# Patient Record
Sex: Male | Born: 1960 | Race: Black or African American | Hispanic: No | Marital: Married | Smoking: Current some day smoker
Health system: Southern US, Community
[De-identification: ages and names within clinical notes are randomized; demographics above are authoritative.]

## PROBLEM LIST (undated history)

## (undated) DIAGNOSIS — E7800 Pure hypercholesterolemia, unspecified: Secondary | ICD-10-CM

## (undated) DIAGNOSIS — I10 Essential (primary) hypertension: Secondary | ICD-10-CM

## (undated) DIAGNOSIS — Z9889 Other specified postprocedural states: Secondary | ICD-10-CM

## (undated) DIAGNOSIS — E119 Type 2 diabetes mellitus without complications: Secondary | ICD-10-CM

## (undated) DIAGNOSIS — M1990 Unspecified osteoarthritis, unspecified site: Secondary | ICD-10-CM

## (undated) DIAGNOSIS — G4730 Sleep apnea, unspecified: Secondary | ICD-10-CM

## (undated) DIAGNOSIS — Z8489 Family history of other specified conditions: Secondary | ICD-10-CM

## (undated) HISTORY — PX: CARDIAC CATHETERIZATION: SHX172

## (undated) HISTORY — PX: COLONOSCOPY: SHX174

---

## 2001-07-20 HISTORY — PX: KNEE ARTHROSCOPY: SUR90

## 2005-12-29 ENCOUNTER — Ambulatory Visit (HOSPITAL_BASED_OUTPATIENT_CLINIC_OR_DEPARTMENT_OTHER): Admission: RE | Admit: 2005-12-29 | Discharge: 2005-12-29 | Payer: Self-pay | Admitting: Orthopaedic Surgery

## 2006-03-31 ENCOUNTER — Ambulatory Visit (HOSPITAL_COMMUNITY): Admission: RE | Admit: 2006-03-31 | Discharge: 2006-04-01 | Payer: Self-pay | Admitting: Orthopaedic Surgery

## 2007-11-22 ENCOUNTER — Ambulatory Visit: Payer: Self-pay | Admitting: *Deleted

## 2007-11-22 ENCOUNTER — Inpatient Hospital Stay (HOSPITAL_COMMUNITY): Admission: RE | Admit: 2007-11-22 | Discharge: 2007-11-27 | Payer: Self-pay | Admitting: Neurosurgery

## 2009-04-27 IMAGING — RF DG LUMBAR SPINE 2-3V
1 series · 2 of 2 positions shown · non-contrast
Comparison: 03/31/2006

CLINICAL DATA: Lumbar spondylosis.  Operative evaluation.

LUMBAR SPINE - 2-3 VIEW

[Series 1: run · 2 of 2 slices shown]
[im 1/2]
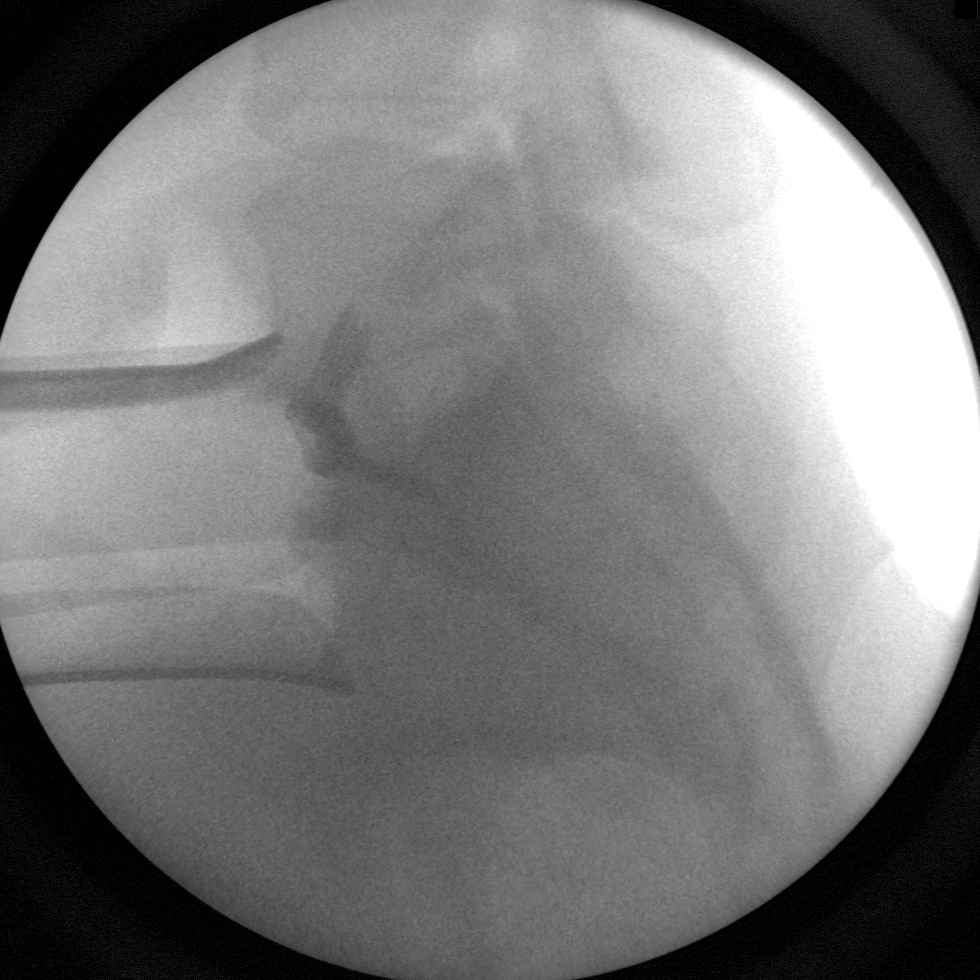
[im 2/2]
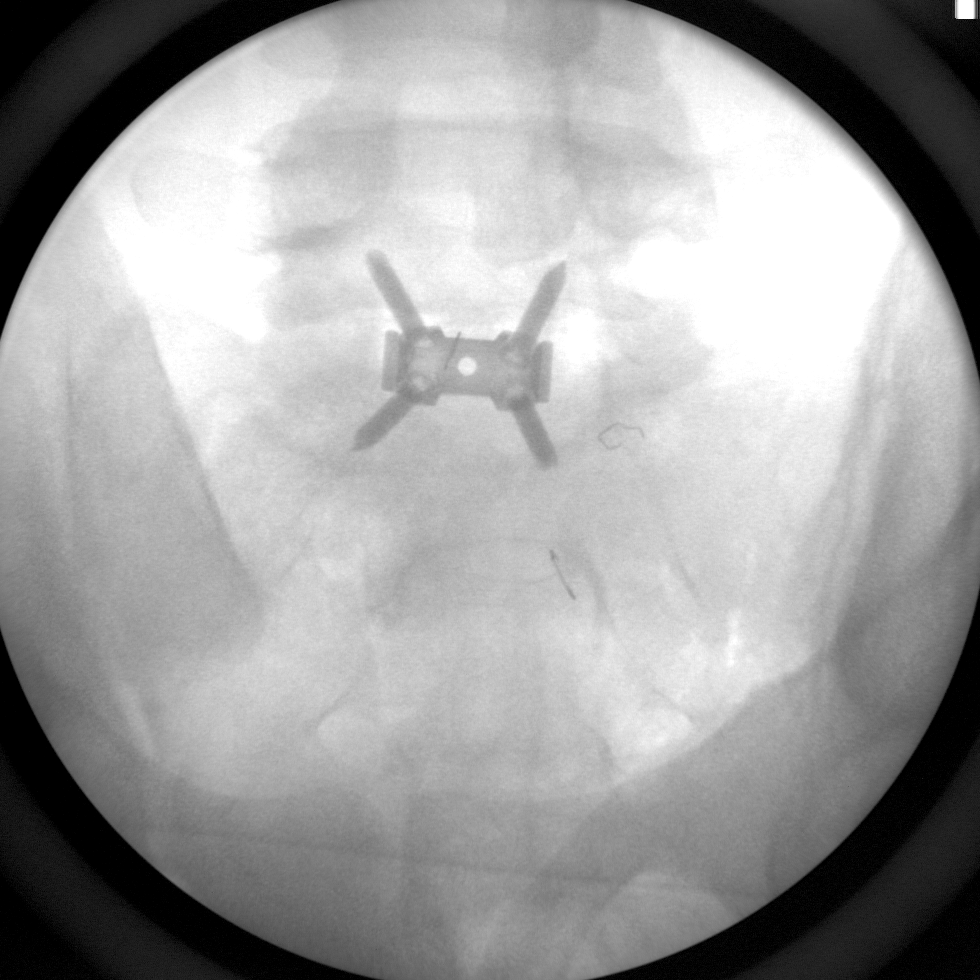

[2 of 2 positions shown; findings below may reference images not displayed]

two images show anterior fusion at the L5-S1 level.  Fusion device
appears well positioned.

## 2009-04-27 IMAGING — CR DG CHEST 1V PORT
1 series · 1 of 1 positions shown · non-contrast
Comparison: Radiograph 11/18/2007

CLINICAL DATA: Lumbar herniated disc repair

PORTABLE CHEST - 1 VIEW

[view not recorded]
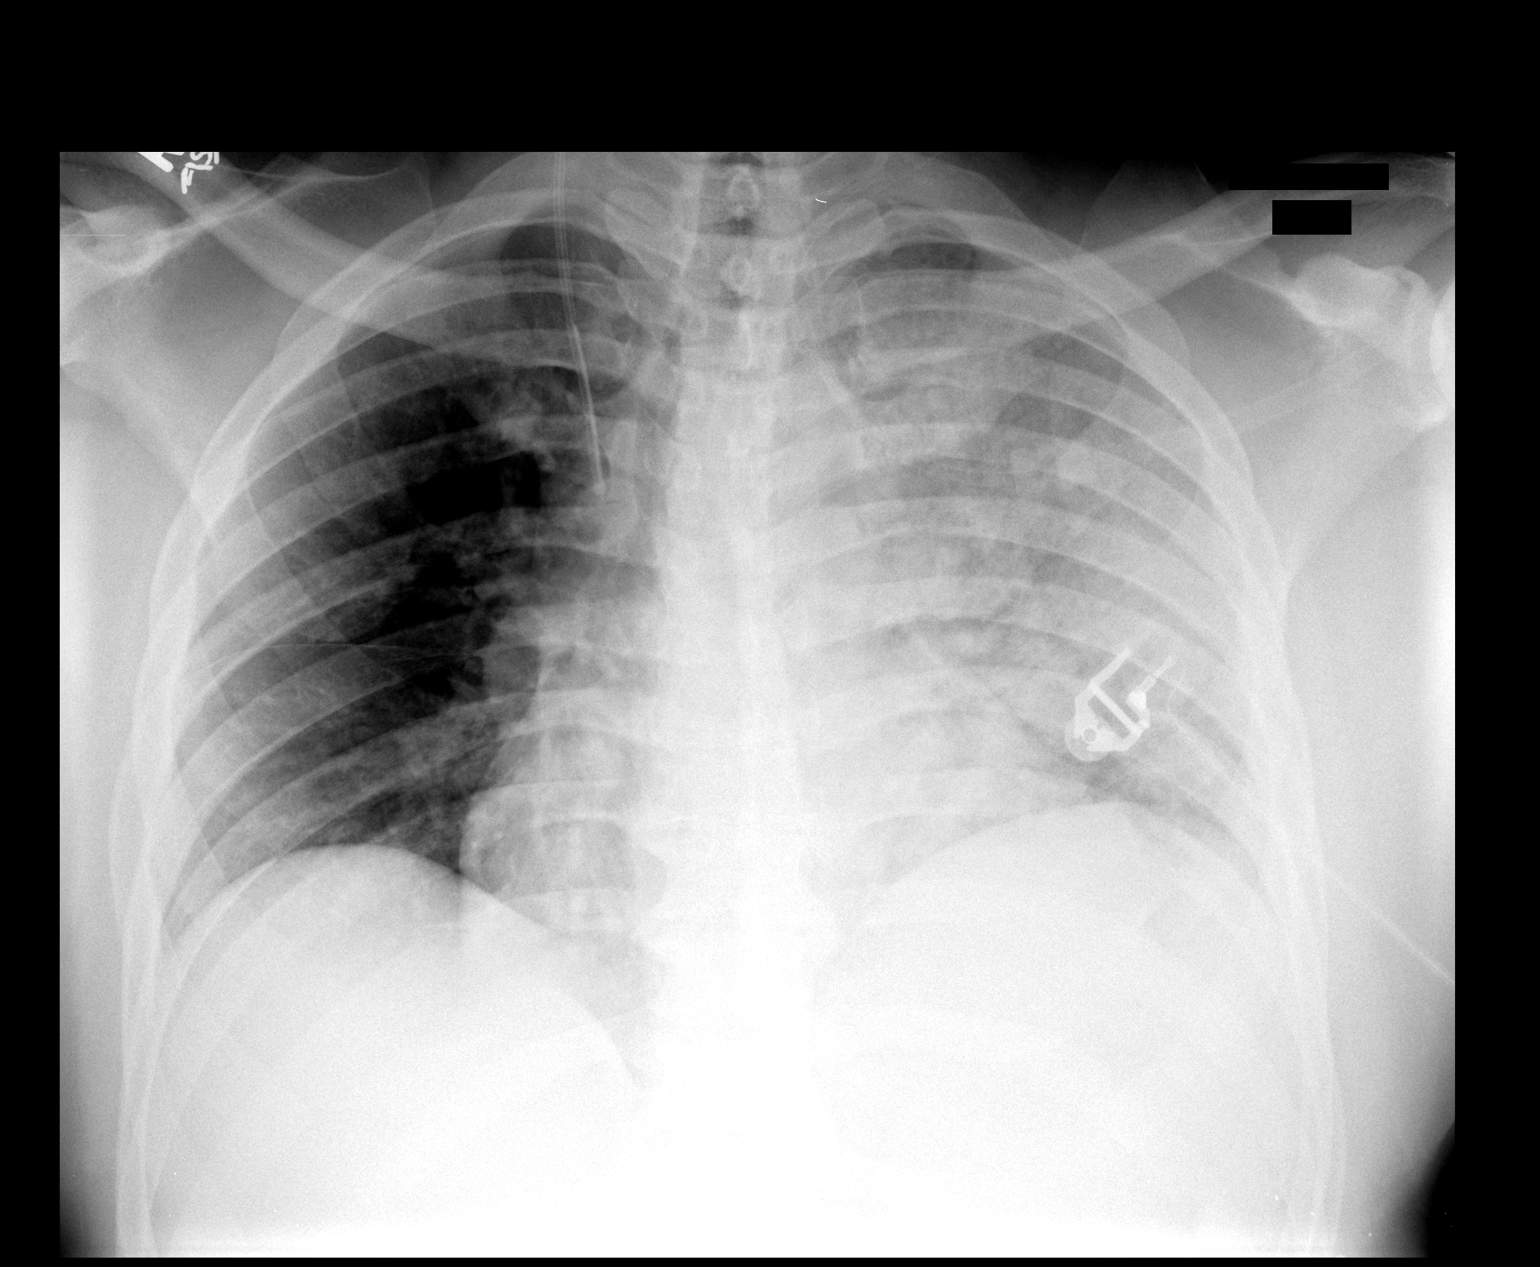

[1 of 1 positions shown; findings below may reference images not displayed]

FINDINGS: Interval placement of a right central venous catheter
from IJ approach with tip in the distal SVC.  No evidence of
pneumothorax.  Cardiac silhouette appears slightly enlarged.  This
may be in part due to position.  There is new diffuse opacity in
the left upper and lower lungs.
IMPRESSION: 1..  Interval placement of right central venous without evidence
pneumothorax.
2.  Opacity in the left hemithorax likely represents a combination
of  asymmetric pulmonary edema and pleural effusion.

## 2009-04-27 IMAGING — CR DG OR LOCAL ABDOMEN
2 series · 2 of 2 positions shown · non-contrast
Comparison: None

Addendum Begins

Two  additional cross table lateral views were provided.  No
evidence of retained surgical instruments.
Addendum Ends
CLINICAL DATA: Herniated lumbar disc.  The anterior fusion at L5,
S1
OR LOCAL ABDOMEN

[view not recorded (1 of 2)]
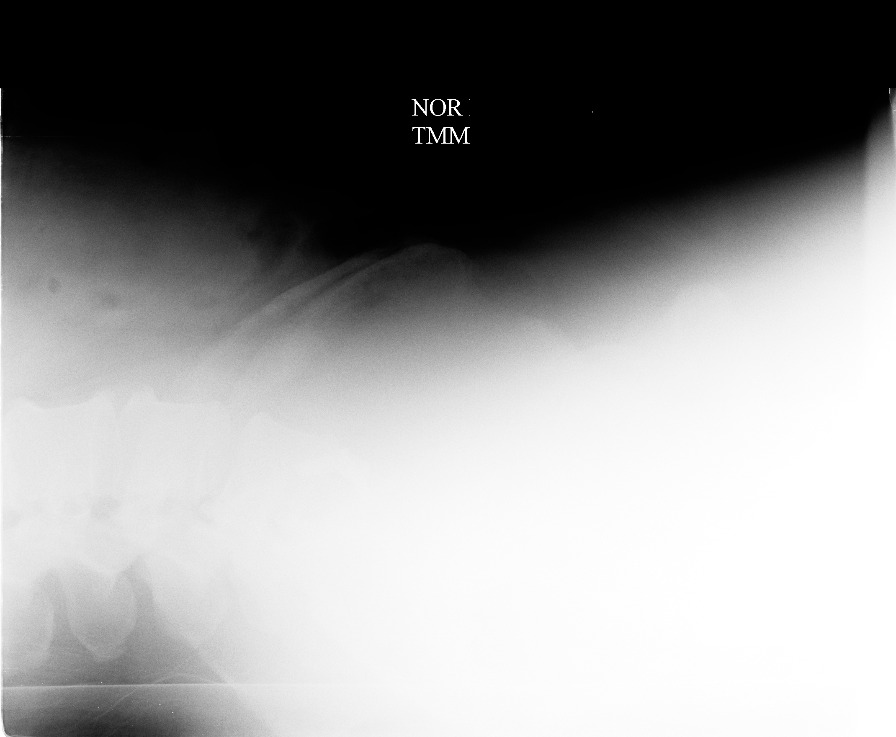

[view not recorded (2 of 2)]
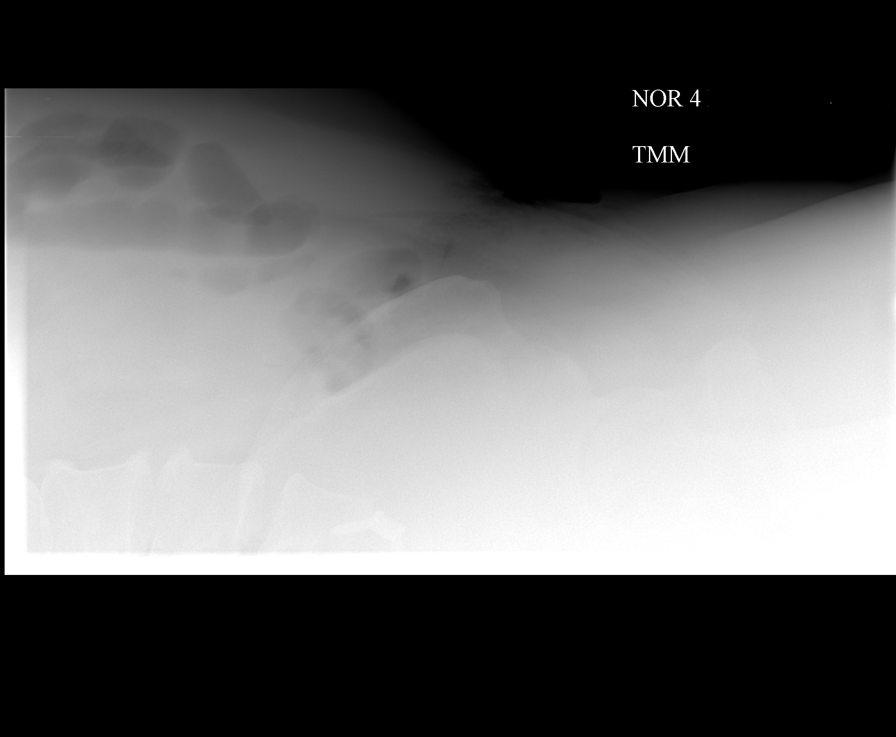

[2 of 2 positions shown; findings below may reference images not displayed]

FINDINGS: No  surgical instruments within the pelvis.  Anterior
fusion at L5, S1 present.  Staple projects over the medial left
aspect of the sacrum.
IMPRESSION: .  No evidence of surgical instruments.

## 2010-07-20 HISTORY — PX: BACK SURGERY: SHX140

## 2010-12-02 NOTE — Op Note (Signed)
NAMEOCTAVIAN, GODEK NO.:  [id number]   MEDICAL RECORD NO.:  [id number]          PATIENT TYPE:  INP   LOCATION:  3009                         FACILITY:  [facility]   PHYSICIAN:  Balinda Quails, M.D.    DATE OF BIRTH:  October 03, 1960   DATE OF PROCEDURE:  11/22/2007  DATE OF DISCHARGE:                               OPERATIVE REPORT   SURGEON:  Balinda Quails, MD   CO-SURGEONDanae Orleans. Venetia Maxon, MD   ANESTHESIA:  General endotracheal.   PREOPERATIVE DIAGNOSIS:  L5-S1 degenerative disk disease.   POSTOPERATIVE DIAGNOSIS:  L5-S1 degenerative disk disease.   PROCEDURE:  L5-S1 anterior lumbar interbody fusion (ALIF).   CLINICAL NOTE:  [name] is a 50 year old gentleman with a  history of chronic back pain and degenerative L5-S1 disk disease.  Scheduled today to undergo L5-S1 ALIF.  This patient was seen  preoperatively and the procedure was discussed in detail.  Also,  potential risks were reviewed including but not limited to DVT,  pulmonary embolus, limb ischemia, bleeding, transfusion, ureter injury,  sexual dysfunction, or other complication.  The patient consented for  surgery.   OPERATIVE PROCEDURE:  The patient was brought to the operating room in  stable hemodynamic condition.  Placed under general endotracheal  anesthesia.  Pulse oximetry placed on the left foot remained stable  throughout the operative procedure.   The abdomen was prepped and draped in sterile fashion.  Transverse skin  incision made in the left lower quadrant at premarked site of the  projection of L5-S1.  Dissection carried down through subcutaneous  tissue with electrocautery.  Left anterior rectus sheath was incised  from midline to lateral margin of rectus muscle.  The rectus muscle was  mobilized.  Retracted medially.  The left retroperitoneal space entered.  The peritoneal contents rotated anteriorly.  The psoas muscle identified  and the genitofemoral nerve preserved on the  psoas muscle.  The left  common and external iliac arteries were skeletonized.  Lymphatics pushed  laterally, ureter and nerves pushed medially.  The L5-S1 disk space was  palpated.  Using blunt dissection, the disk space was exposed and the  middle sacral vessels were ligated with clips and divided.  Presacral  fascia over the disk was incised and the soft tissues pushed off at the  L5-S1 disk from left to right.   The disk was fully exposed and using reverse lip Brau blades hooked on  the lateral margins of L5-S1.  The disk was fully exposed with malleable  retractors placed inferiorly and superiorly.   Dr. Venetia Maxon then completed L5-S1 ALIF.  Closure also carried out by Dr.  Venetia Maxon.   There were no apparent complications.  The patient was transferred to  recovery room in stable condition.      Balinda Quails, M.D.  Electronically Signed     PGH/MEDQ  D:  11/23/2007  T:  11/24/2007  Job:  [id number]

## 2010-12-02 NOTE — Op Note (Signed)
NAMERONNY, KORFF NO.:  [id number]   MEDICAL RECORD NO.:  [id number]          PATIENT TYPE:  INP   LOCATION:  3009                         FACILITY:  [facility]   PHYSICIAN:  Danae Orleans. Venetia Maxon, M.D.  DATE OF BIRTH:  January 11, 1961   DATE OF PROCEDURE:  11/22/2007  DATE OF DISCHARGE:                               OPERATIVE REPORT   PREOPERATIVE DIAGNOSES:  Herniated lumbar disk with spondylosis,  degenerative disk disease, and radiculopathy L5-S1 level.   POSTOPERATIVE DIAGNOSES:  Herniated lumbar disk with spondylosis,  degenerative disk disease, and radiculopathy L5-S1 level.   PROCEDURE:  L5-S1 decompression with anterior lumbar interbody fusion  with PEEK cage and screws, FortrOss bone graft extender with autogenous  blood and bone morphogenic protein.   SURGEON:  Danae Orleans. Venetia Maxon, MD   ASSISTANTMichail Jewels, RN and Madilyn Fireman, MD   ANESTHESIA:  General endotracheal anesthesia.   ESTIMATED BLOOD LOSS:  Minimal.   COMPLICATIONS:  None.   DISPOSITION:  Recovery.   INDICATIONS:  Colton Powell is a 50 year old man with a lumbar disk  herniation at L5-S1, he has previously undergone foraminotomy at L5-S1  on the right by another surgeon, he has recurrent right leg pain with L5  radiculopathy.  It was elected to take him to surgery for decompression  and anterior lumbar interbody fusion at the L5-S1 level.   PROCEDURE:  Colton Powell was brought to the operating room.  Following a  satisfactory and uncomplicated induction of general endotracheal  anesthesia and placement of intravenous lines and Foley catheter, he was  placed in a supine position on the operating table.  His low abdomen and  upper pelvic region and upper pubic region were shaved, then prepped and  draped in usual sterile fashion using Betadine scrub and paint.  After  localizing C-arm fluoroscopy, images were obtained.  An incision was  marked and carried through the skin to the rectus sheath, which was  opened with electrocautery.  This was performed by Dr. Madilyn Fireman, I assisted  him.  He will dictate this portion of procedure.  After Thompson  retractors were placed to expose the L5-S1 level, I took over and  performed a thorough diskectomy incising the anterior annulus.  An  intraoperative localizing x-ray with a marker needle at the L5-S1 level  was obtained demonstrating both midline and correct level.  The disk  space was then incised, disk material was removed in piecemeal fashion,  and end plates were decorticated, and cleared of investing disk  material.  There was a significant amount of spondylitic material deep  within the interspace.  This approach was extremely difficult because of  extremely steep sacral inflammation and the patient's large body  habitus, but it was possible to thoroughly remove the disk material and  clear the disk space of residual disk material, also spondylitic end  plate overgrowth was removed particularly on the right side, which is  the patient's symptomatic side with a variety of Kerrison rongeurs.  The  end plates were stripped of residual disk material and the decompression  was carried to the posterior annulus.  After trial sizing a variety of  implants, a PEEK titanium SynFix cage measuring 30 mm x 38 mm with 12 mm  width and a 12-degree lordosis height was then selected, prepared with  FortrOss with autogenous blood, which had been aspirated from the marrow  rich red vertebral body in the sacrum and also extra small BMP kit.  This was inserted using the SQUID device, deployed the implant and it  was then fixed using 20-mm screws according to standard fashion with  good rigid-appearing fixation.  AP and lateral radiographs demonstrated  well-positioned spacer, and it was felt that his height was  significantly reestablished.  The remaining FortrOss was packed around  the cage.  The wound was then irrigated, the Thompson retractors  removed, there was  no evidence of any vascular injury.  The fascia was  then closed with 0 Vicryl running stitches and subcutaneous layer  reapproximated with 2-0 Vicryl interrupted inverted sutures and skin  edges reapproximated with interrupted 3-0 Vicryl subcuticular stitch.  The wound was dressed with Benzoin, Steri-Strips, and sterile occlusive  dressing was placed.  At the end of this procedure, an AP film was  obtained, which demonstrated well-positioned interbody graft, but also  retained staple.  Additional x-rays demonstrated this was not  superficial, and I spoke with Dr. Madilyn Fireman who was of the opinion that this  was not a concern and this should be left alone.  Sterile occlusive  dressing was placed.  The patient was then allowed to wake up in the  operating room, extubated, and taken to recovery in stable and  satisfactory condition having tolerated the procedure well.      Danae Orleans. Venetia Maxon, M.D.  Electronically Signed     JDS/MEDQ  D:  11/22/2007  T:  11/23/2007  Job:  [id number]

## 2010-12-05 NOTE — Discharge Summary (Signed)
NAMEALMUS, WOODHAM NO.:  [id number]   MEDICAL RECORD NO.:  [id number]          PATIENT TYPE:  INP   LOCATION:  3009                         FACILITY:  [facility]   PHYSICIAN:  Danae Orleans. Venetia Maxon, M.D.  DATE OF BIRTH:  April 09, 1961   DATE OF ADMISSION:  11/22/2007  DATE OF DISCHARGE:  11/27/2007                               DISCHARGE SUMMARY   REASON FOR ADMISSION:  1. Lumbosacral disk displacement.  2. Lumbosacral disk degeneration.  3. Lumbosacral spondylosis.  4. Hypertension, NOS.   FINAL DIAGNOSES:  1. Lumbosacral disk displacement.  2. Lumbosacral  disk degeneration.  3. Lumbosacral spondylosis.  4. Hypertension, NOS.   HISTORY OF ILLNESS AND HOSPITAL COURSE:  Colton Powell is a 50 year old  man with a herniated lumbar disk with significant foraminal stenosis and  disk degeneration at the L5 to S1 level, was elected to take him to  surgery for L5 to S1 anterior lumbar interbody fusion.  Post day, the  patient did well with surgery.  Postoperatively, he had resolution of  his leg discomfort.  He had good strength in both lower extremities.  He  was gradually immobilized in back brace, and was started on Lovenox and  Dulcolax suppository, and gradually was moving his bowels and doing well  with instrcutions to follow up with Dr. Venetia Maxon in the office for  postoperative visit.   DISCHARGE MEDICATIONS:  Percocet, Flexeril, and Lovenox for 8 days.      Danae Orleans. Venetia Maxon, M.D.  Electronically Signed     JDS/MEDQ  D:  01/05/2008  T:  01/06/2008  Job:  [id number]

## 2010-12-05 NOTE — Op Note (Signed)
NAME:  MERLEN, GURRY NO.:  [id number]   MEDICAL RECORD NO.:  [id number]          PATIENT TYPE:  AMB   LOCATION:  NESC                         [facility]ACILITY:  [facility]   PHYSICIAN:  Sharolyn Douglas, M.D.        DATE OF BIRTH:  [birth year]   DATE OF PROCEDURE:  12/24/2005  DATE OF DISCHARGE:                                 OPERATIVE REPORT   PREOPERATIVE DIAGNOSIS:  Lumbar degenerative disk disease and foraminal  stenosis L5-S1.   POSTOPERATIVE DIAGNOSIS:  Lumbar degenerative disk disease and foraminal  stenosis L5-S1.   PROCEDURE:  Right L5-S1 epidural steroid injection and fluoroscopic imaging  for needle placement during epidural injection.   SURGEON:  Sharolyn Douglas, M.D.   ASSISTANT:  None.   ANESTHESIA:  MAC plus local.   COMPLICATIONS:  None.   BLOOD LOSS:  None.   INDICATIONS:  The patient is a pleasant 50 year old male with persistent  back and right leg pain.  His imaging studies suggested L5-S1 foraminal  narrowing and at this point he has elected to undergo an epidural steroid  injection in hopes of improving his symptoms.  The risks and benefits are  reviewed.   PROCEDURE:  The patient was identified in the holding and taken to the  operating room.  He underwent sedation by anesthesia, turned prone, back  prepped and draped in the usual sterile fashion.  Initially, we attempted a  transforaminal injection on the right side using a 22 gauge Quincke spinal  needle.  Due to the patient's body habitus, we were unable to successfully  place the needle at the 6 o'clock position below the L5 pedicle.  We,  therefore, elected to proceed with an L5-S1 interlaminar injection on the  right side.  Using AP fluoroscopy, a 17-gauge Tuohy needle was advanced  towards the interlaminar space on the right side.  We then switched to a  lateral view and used loss-of-resistance technique along with the  fluoroscopy to place the needle into the epidural space. Aspiration was  formed.  There was no CSF or blood.  1 mL of Omnipaque injected with good  epidural spread.  We then injected a solution of 80 mg of Depo-Medrol and 4  mL of preservative-free lidocaine.  The Tuohy needle was removed, a  Band-Aid placed.  The patient was turned supine and transferred to recovery  in stable condition, neurologically intact. Post instructions were reviewed.  If he has any problems, he will call the office, otherwise, I will see him  in two weeks.      Sharolyn Douglas, M.D.  Electronically Signed     MC/MEDQ  D:  12/29/2005  T:  12/29/2005  Job:  [id number]

## 2010-12-05 NOTE — Op Note (Signed)
NAME:  Colton Powell, Colton Powell NO.:  [id number]   MEDICAL RECORD NO.:  [id number]          PATIENT TYPE:  OIB   LOCATION:  5035                         FACILITY:  [facility]   PHYSICIAN:  Sharolyn Douglas, M.D.        DATE OF BIRTH:  10/21/1960   DATE OF PROCEDURE:  03/31/2006  DATE OF DISCHARGE:  04/01/2006                                 OPERATIVE REPORT   DIAGNOSIS:  Right L5-S1 degenerative disk disease with lateral recess and  foraminal stenosis.   PROCEDURE:  Right L5-S1 micro-endoscopic diskectomy and foraminotomy with  decompression of the right L5 and S1 nerve roots.   SURGEON:  Sharolyn Douglas, M.D.   ASSISTANT:  Verlin Fester, P.A.   ANESTHESIA:  General endotracheal.   ESTIMATED BLOOD LOSS:  Minimal.   COMPLICATIONS:  None.   COUNTS:  Needle, instrument and sponge count are correct.   INDICATIONS:  The patient is a pleasant 50 year old male with persistent  back and right lower extremity pain in the L5 and S1 distribution.  He  failed all efforts at conservative treatment including extensive physical  therapy and epidural steroid injections.  His imaging studies show foraminal  and lateral recess narrowing at L5-S1 due to degenerative changes.  He now  presents for micro-endoscopic diskectomy and foraminotomy in hopes of  improving his symptoms.  Risk and benefits were reviewed, and he elected to  proceed.   DESCRIPTION OF PROCEDURE:  After informed consent, the patient was taken to  the operating room.  He underwent general endotracheal anesthesia without  difficulty, given prophylactic IV antibiotics..  Carefully turned prone onto  the [name] frame.  All bony prominences padded.  Face and eyes protected all  times.  Back prepped and draped in the usual sterile fashion.  Fluoroscopy  was brought into the field.  The L5-S1level was identified.  A 2.5 cm  incision was made over the L5-S1 segment.  The deep fascia was incised.  Dilators from the Max-S retractor were  used to dock onto the L5-S1  interspace.  After placing the final dilator, the Max-S retractor with the  length 70-mm blades was placed over the final dilator, attached to the  operating room table using the attachment arm and then gently spread.  This  dilated the paraspinal muscles and allowed access to the L5-S1 interlaminar  space.  At this point, additional fluoroscopic images were taken with an  instrument placed directly over the interspace, confirming that we were at  the appropriate level.  We then removed the fluoroscopy machine and brought  in the microscope.  A high-speed bur was used to remove the medial one-half  of the L5-S1 facet joint complex along with the inferior two-thirds of the  L5 lamina.  Ligamentum flavum was removed piecemeal.  We identified the  lateral border of the thecal sac and the S1 nerve root.  We then did a wide  lateral recess decompression flush with the S1 pedicle completely  decompressing the S1 nerve root.  We then continued the decompression  proximally.  The L5 nerve root was identified at  its takeoff from the common  dural sac.  The foramen was evaluated with a blunt probe and found to be  extremely stenotic due to overgrowth of the superior facet as well as  compression between the pedicles of L5 and S1.  The superior facet was then  removed using various Kerrison punches.  Ligamentum flavum was also removed  from the foramen.  We then reevaluated the L5-S1 foramen with a blunt probe  and found that it was decompressed.  We felt that there may still be some  pressure occurring anteriorly, and we then entered the disk space and worked  laterally with an Epstein curet.  We, in fact, were able to further  decompress the foramen and removed several large fragments of disk using a  backbiting pituitary.  Hemostasis was achieved.  We felt there was an  excellent decompression of the L5 and S1 nerve roots.  Then 2 mL of fentanyl  were left in the  exposed epidural space for postoperative analgesia.  The  deep fascia was closed with 10-0 Vicryl suture.  Subcutaneous layer closed  with 0 Vicryl and 2-0 Vicryl followed by Dermabond on the skin edges.  The  patient was turned supine, extubated without difficulty, transferred to the  recovery in stable condition, able to move his upper lower extremities.  It  should be noted that my assistant, Verlin Fester, P.A., helped throughout  the procedure including during the positioning, the exposure, working under  the microscope during the decompression, and she assisted with wound  closure.      Sharolyn Douglas, M.D.  Electronically Signed     MC/MEDQ  D:  03/31/2006  T:  04/01/2006  Job:  [id number]

## 2013-10-18 HISTORY — PX: HERNIA REPAIR: SHX51

## 2014-11-28 ENCOUNTER — Other Ambulatory Visit: Payer: Self-pay | Admitting: General Surgery

## 2014-11-28 DIAGNOSIS — R1084 Generalized abdominal pain: Secondary | ICD-10-CM

## 2014-11-29 ENCOUNTER — Other Ambulatory Visit: Payer: Self-pay | Admitting: General Surgery

## 2014-11-29 DIAGNOSIS — R1084 Generalized abdominal pain: Secondary | ICD-10-CM

## 2014-12-06 ENCOUNTER — Ambulatory Visit: Payer: Self-pay | Admitting: General Surgery

## 2014-12-06 ENCOUNTER — Other Ambulatory Visit: Payer: Self-pay | Admitting: General Surgery

## 2015-01-23 ENCOUNTER — Other Ambulatory Visit (HOSPITAL_COMMUNITY): Payer: Self-pay | Admitting: *Deleted

## 2015-01-23 NOTE — Pre-Procedure Instructions (Addendum)
    Colton JacquetRobert Powell  01/23/2015      Your procedure is scheduled on Friday, February 01, 2015 at 7:30 AM.   [facility] at 5:30 AM.   Call this number if you have problems the morning of surgery: [phone]   Any questions prior to day of surgery, please call [phone] between 8 & 4 PM.   Remember:  Do not eat food or drink liquids after midnight Thursday, 01/31/15.  Take these medicines the morning of surgery with A SIP OF WATER: none  Stop Aspirin and Fish Oil 7 days prior to surgery.   Do not wear jewelry.  Do not wear lotions, powders, or cologne.  You may wear deodorant.  Men may shave face and neck.  Do not bring valuables to the hospital.  [facility] is not responsible for any belongings or valuables.  Contacts, dentures or bridgework may not be worn into surgery.  Leave your suitcase in the car.  After surgery it may be brought to your room.  For patients admitted to the hospital, discharge time will be determined by your treatment team.  Special instructions:  See "Preparing for Surgery" Instruction sheet.    Please read over the following fact sheets that you were given. Pain Booklet, Coughing and Deep Breathing and Surgical Site Infection Prevention

## 2015-01-24 ENCOUNTER — Encounter (HOSPITAL_COMMUNITY)
Admission: RE | Admit: 2015-01-24 | Discharge: 2015-01-24 | Disposition: A | Discharge status: Home / Self Care | Payer: Worker's Compensation | Source: Ambulatory Visit | Attending: General Surgery | Admitting: General Surgery

## 2015-01-24 ENCOUNTER — Other Ambulatory Visit: Payer: Self-pay

## 2015-01-24 ENCOUNTER — Encounter (HOSPITAL_COMMUNITY): Payer: Self-pay

## 2015-01-24 DIAGNOSIS — Z01812 Encounter for preprocedural laboratory examination: Secondary | ICD-10-CM | POA: Insufficient documentation

## 2015-01-24 DIAGNOSIS — K429 Umbilical hernia without obstruction or gangrene: Secondary | ICD-10-CM | POA: Diagnosis not present

## 2015-01-24 DIAGNOSIS — I10 Essential (primary) hypertension: Secondary | ICD-10-CM | POA: Diagnosis not present

## 2015-01-24 HISTORY — DX: Essential (primary) hypertension: I10

## 2015-01-24 HISTORY — DX: Sleep apnea, unspecified: G47.30

## 2015-01-24 LAB — BASIC METABOLIC PANEL
Anion gap: 8 (ref 5–15)
BUN: 6 mg/dL (ref 6–20)
CALCIUM: 9.4 mg/dL (ref 8.9–10.3)
CO2: 27 mmol/L (ref 22–32)
CREATININE: 0.9 mg/dL (ref 0.61–1.24)
Chloride: 102 mmol/L (ref 101–111)
GFR calc Af Amer: >60 mL/min (ref >60)
GLUCOSE: 127 mg/dL — AB (ref 65–99)
Potassium: 3.4 mmol/L — ABNORMAL LOW (ref 3.5–5.1)
Sodium: 137 mmol/L (ref 135–145)

## 2015-01-24 LAB — CBC
HEMATOCRIT: 46.5 % (ref 39.0–52.0)
HEMOGLOBIN: 15.1 g/dL (ref 13.0–17.0)
MCH: 29.4 pg (ref 26.0–34.0)
MCHC: 32.5 g/dL (ref 30.0–36.0)
MCV: 90.5 fL (ref 78.0–100.0)
PLATELETS: 216 10*3/uL (ref 150–400)
RBC: 5.14 MIL/uL (ref 4.22–5.81)
RDW: 13.4 % (ref 11.5–15.5)
WBC: 3.2 10*3/uL — ABNORMAL LOW (ref 4.0–10.5)

## 2015-01-24 NOTE — Progress Notes (Addendum)
No orders in epic, notified Dr. Tawana ScaleWilson's office.  PCP: [facility] Juliann Pulsericky [facility]art [facility] [place]  Dr. Valentina GuLucy Love: sleep apnea clinic [place]--will request sleep study.  No cardiologist. States he had a stress test 2011- results normal.  Stated he had a piece of food stuck in his throat,had pain at the site. Went to doctor and he did the heimlich maneuver and pt. Threw up. Sent him for stress test, told him results were normal.

## 2015-01-25 ENCOUNTER — Encounter (HOSPITAL_COMMUNITY): Payer: Self-pay

## 2015-01-25 NOTE — Progress Notes (Addendum)
Anesthesia Chart Review: Patient is a 54 year old male scheduled for laparoscopic assisted repair of supraumbilical hernia on 02/01/15 by Dr. Andrey CampanileWilson.  History includes non-smoker, HTN, OSA with CPAP use, UHR '15, back surgery '12.  BMI is consistent with obesity. PCP is Dr. Simmie Daviesicky Stugart in [place] (see Care Everywhere). Neurologist is Dr. Edythe LynnLucie Lauve (for OSA). According to notes in Care Everywhere, he was also seen by cardiology (Dr. Recardo Evangelistharles Harris, last visit in Care Everywhere is from 04/05/12) for an episode of PAF in 2010. He was initially treated with Toprol, but at this visit Dr. Tiburcio PeaHarris said patient could begin to wean it due to fatigue. There is also documentation in Care Everywhere about a hospital admission for what was initially felt to be acute viral illness/gastroenteritis, but cardiac cath was pursed due to an elevated troponin of 0.02. Notes indicate that he had "near normal coronaries."   Meds include ASA, Lipitor, Fish oil, Aldactazide.  01/24/15 EKG: SR with incomplete right BBB. I don't have the tracing, but according to his E[facility] interpretation on [facility] at [facility] (Care Everywhere), his EKG then also showed NSR with incomplete right BBB.   According to 09/04/11 Hospital Encounter by Dr Abelardo DieselJeffrey Clevenger (Care Everywhere), Cardiac Cath showed: "near normal coronary arteries with normal left ventricular systolic function."   According to 04/05/12 cardiology office note by Dr. Recardo Evangelistharles Harris, "Stress-echo 2010: EF 55, LAE, no valve dz: no ischemia."  Preoperative labs noted.   A copy of his 2013 cardiac cath was requested, but according to notes showed no significant CAD. He is maintaining SR. He is on CPAP for his OSA.  Based on currently available records, I anticipate that if there are no acute changes then he can proceed as planned.  Velna Ochsllison Briseyda Fehr, PA-C [facility]Anesthesiology Phone [phone] 01/25/2015 2:26 PM  Addendum: [facility] sent the handwritten  LHC report from 09/07/11 that showed: Near normal coronaries (by handwritten notations it appears there were only scattered 0-10% stenoses--by diagram it looks like involving the LAD, CX and RCA). Left ventriculogram showed normal chamber size, normal wall motion, no MR, EF 65%. At this time, [facility] did not have or could not locate a typed cath report to send.   Velna Ochsllison Shenea Giacobbe, PA-C [facility]Anesthesiology Phone [phone] 01/28/2015 12:33 PM

## 2015-01-28 ENCOUNTER — Encounter (HOSPITAL_COMMUNITY): Payer: Self-pay

## 2015-01-28 NOTE — Progress Notes (Signed)
Spoke to [facility] they report they are working on getting cardiac cath records to us delay is due to being in an old system

## 2015-01-31 ENCOUNTER — Ambulatory Visit: Payer: Self-pay | Admitting: General Surgery

## 2015-01-31 MED ORDER — CEFAZOLIN SODIUM 10 G IJ SOLR
3.0000 g | INTRAMUSCULAR | Status: AC
  Administered 2015-02-01: 3 g via INTRAVENOUS
  Filled 2015-01-31 (×2): qty 3000

## 2015-01-31 NOTE — Anesthesia Preprocedure Evaluation (Addendum)
Anesthesia Evaluation  Patient identified by MRN, date of birth, ID band Patient awake    Reviewed: Allergy & Precautions, NPO status , Patient's Chart, lab work & pertinent test results, reviewed documented beta blocker date and time   Airway Mallampati: I  TM Distance: >3 FB Neck ROM: Full    Dental  (+) Teeth Intact, Dental Advisory Given, Partial Lower   Pulmonary sleep apnea and Continuous Positive Airway Pressure Ventilation ,  breath sounds clear to auscultation        Cardiovascular hypertension, Pt. on medications Rhythm:Regular  PAF 2010, Normal CATH 2013   Neuro/Psych    GI/Hepatic negative GI ROS, Neg liver ROS,   Endo/Other  negative endocrine ROS  Renal/GU negative Renal ROS     Musculoskeletal   Abdominal (+)  Abdomen: soft.    Peds  Hematology 15/46   Anesthesia Other Findings   Reproductive/Obstetrics                           Anesthesia Physical Anesthesia Plan  ASA: II  Anesthesia Plan: General   Post-op Pain Management:    Induction: Intravenous  Airway Management Planned: Oral ETT  Additional Equipment:   Intra-op Plan:   Post-operative Plan: Extubation in OR  Informed Consent: I have reviewed the patients History and Physical, chart, labs and discussed the procedure including the risks, benefits and alternatives for the proposed anesthesia with the patient or authorized representative who has indicated his/her understanding and acceptance.     Plan Discussed with:   Anesthesia Plan Comments:         Anesthesia Quick Evaluation

## 2015-01-31 NOTE — H&P (Signed)
Thailan Sava 12/06/2014 2:58 PM Location: [facility] Patient #: [id number] DOB: [birth year] Married / Language: English / Race: Black or African American Male  History of Present Illness Minerva Areola M. Hendryx Ricke MD; 12/06/2014 5:42 PM) Patient words: discuss surgery.  The patient is a 54 year old male who presents with an incisional hernia. I initially saw him several weeks ago for a second opinion regarding his supraumbilical hernia. Since that time he underwent a CT scan and comes back in today to discuss results of his CT scan. He denies any changes other than pulling a left groin muscle 2 days ago. He states his discomfort is getting better. He did see his primary care physician and was put on muscle relaxant. He denies any fever, chills, nausea, vomiting, diarrhea or constipation. He denies any chest pain, chest pressure, shortness of breath.   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  Diagnostic Studies History Atilano Ina, MD; 12/06/2014 5:44 PM) Colonoscopy 1-5 years ago  Allergies (Ammie Eversole, LPN; 4/40/3474 2:59 PM) No Known Drug Allergies04/14/2016  Medication History (Ammie Eversole, LPN; 5/63/8756 4:33 PM) Spironolactone-HCTZ (25-25MG  Tablet, Oral) Active. Atorvastatin Calcium (  Tablet, Oral) Active. Flexeril (  Tablet, Oral) Active. TraMADol HCl ER (  Tablet ER 24HR, Oral) Active.  Social History Atilano Ina, MD; 12/06/2014 5:44 PM) No drug use Caffeine use Tea. Alcohol use Occasional alcohol use. Tobacco use Never smoker.  Family History Atilano Ina, MD; 12/06/2014 5:44 PM) Hypertension Mother.  Review of Systems Atilano Ina, MD; 12/06/2014 5:41 00) General Not Present- Appetite Loss, Chills, Fatigue, Fever, Night Sweats, Weight Gain and Weight Loss. Skin Not Present- Change in Wart/Mole, Dryness, Hives, Jaundice, New Lesions, Non-Healing Wounds, Rash and Ulcer. HEENT Not Present- Earache, Hearing Loss, Hoarseness, Nose Bleed, Oral Ulcers, Ringing in the Ears, Seasonal Allergies, Sinus Pain, Sore Throat, Visual Disturbances, Wears glasses/contact lenses and Yellow Eyes. Respiratory Not Present- Bloody sputum, Chronic Cough, Difficulty Breathing, Snoring and Wheezing. Breast Not Present- Breast Mass, Breast Pain, Nipple Discharge and Skin Changes. Cardiovascular Not Present- Chest Pain, Difficulty Breathing Lying Down, Leg Cramps, Palpitations, Rapid Heart Rate, Shortness of Breath and Swelling of Extremities. Gastrointestinal Not Present- Abdominal Pain, Bloating, Bloody Stool, Change in Bowel Habits, Chronic diarrhea, Constipation, Difficulty Swallowing, Excessive gas, Gets full quickly at meals, Hemorrhoids, Indigestion, Nausea, Rectal Pain and Vomiting. Male Genitourinary Not Present- Blood in Urine, Change in Urinary Stream, Frequency, Impotence, Nocturia, Painful Urination, Urgency and Urine Leakage.   Vitals (Ammie Eversole LPN; 2/95/1884 1:66 PM) 12/06/2014 2:58 PM Weight: 275 lb Height: 72in Body Surface Area: 2.52 m Body Mass Index: 37.3 kg/m Pulse: 88 (Regular)  BP: 152/98 (Sitting, Left Arm, Standard)    Physical Exam Minerva Areola M. Azura Tufaro MD; 12/06/2014 5:41 PM) General Mental Status-Alert. General Appearance-Consistent with stated age. Hydration-Well hydrated. Voice-Normal. Note: obese   Head and Neck Head-normocephalic, atraumatic with no lesions or palpable masses. Trachea-midline. Thyroid Gland Characteristics - normal size and consistency.  Eye Eyeball - Bilateral-Extraocular  movements intact. Sclera/Conjunctiva - Bilateral-No scleral icterus.  Chest and Lung Exam Chest and lung exam reveals -quiet, even and easy respiratory effort with no use of accessory muscles and on auscultation, normal breath  sounds, no adventitious sounds and normal vocal resonance. Inspection Chest Wall - Normal. Back - normal.  Breast - Did not examine.  Cardiovascular Cardiovascular examination reveals -normal heart sounds, regular rate and rhythm with no murmurs and normal pedal pulses bilaterally.  Abdomen Inspection  Inspection of the abdomen reveals: Note: has upper midline diastasis; just above umbilicus there is a small fascial defect in the supraumbilical position probably about 2 cm; at the old infraumbilical incision there seems to be a possible small defect or so. no cellulitis, induration, fluctuance. some tenderness to deep palpation. Skin - Scar - Note: transverse infraumbilical incision. Palpation/Percussion Palpation and Percussion of the abdomen reveal - Soft, Non Tender, No Rebound tenderness, No Rigidity (guarding) and No hepatosplenomegaly. Auscultation Auscultation of the abdomen reveals - Bowel sounds normal.  Peripheral Vascular Upper Extremity Palpation - Pulses bilaterally normal.  Neurologic Neurologic evaluation reveals -alert and oriented x 3 with no impairment of recent or remote memory. Mental Status-Normal.  Neuropsychiatric The patient's mood and affect are described as -normal. Judgment and Insight-insight is appropriate concerning matters relevant to self.  Musculoskeletal Normal Exam - Left-Upper Extremity Strength Normal and Lower Extremity Strength Normal. Normal Exam - Right-Upper Extremity Strength Normal and Lower Extremity Strength Normal.  Lymphatic Head & Neck  General Head & Neck Lymphatics: Bilateral - Description - Normal. Axillary - Did not examine. Femoral & Inguinal - Did not examine.    Assessment &  Plan Minerva Areola(Maricella Filyaw M. Katreena Schupp MD; 12/06/2014 5:44 PM) SUPRAUMBILICAL HERNIA (553.9  K43.9) Impression: After reviewing his CT scan he does have a small supraumbilical hernia. We discussed observation versus surgical repair. He is interested in surgical repair. We discussed risk and benefits extensively. They were provided education. He was accompanied by his wife today. We discussed different surgical approaches such as open versus laparoscopic versus laparoscopic assisted. We talked about the pros and cons of mesh insertion. With respect to his job and line of work and do believe he needs his muscle reapproximated. I proposed doing a laparoscopic assisted supraumbilical hernia repair with mesh. We would sew the muscle back together primarily open and then used laparoscopy to put a mesh underlay to reinforce the repair. He has elected to proceed with surgery. The risk and benefits and typical postoperative course were explained in detail and documented in his patient education handout Current Plans  Schedule for Surgery Pt Education - [facility] Free Text Education/Instructions: discussed with patient and provided information. 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 Andrey CampanileWilson, MD, FACS General, Bariatric, & Minimally Invasive Surgery [facility] [place]

## 2015-02-01 ENCOUNTER — Ambulatory Visit (HOSPITAL_COMMUNITY)
Admission: RE | Admit: 2015-02-01 | Discharge: 2015-02-01 | Disposition: A | Discharge status: Home / Self Care | Payer: Worker's Compensation | Source: Ambulatory Visit | Attending: General Surgery | Admitting: General Surgery

## 2015-02-01 ENCOUNTER — Ambulatory Visit (HOSPITAL_COMMUNITY): Payer: Worker's Compensation | Admitting: Vascular Surgery

## 2015-02-01 ENCOUNTER — Ambulatory Visit (HOSPITAL_COMMUNITY): Payer: Worker's Compensation | Admitting: Anesthesiology

## 2015-02-01 ENCOUNTER — Encounter (HOSPITAL_COMMUNITY)
Admission: RE | Disposition: A | Discharge status: Home / Self Care | Payer: Self-pay | Source: Ambulatory Visit | Attending: General Surgery

## 2015-02-01 ENCOUNTER — Encounter (HOSPITAL_COMMUNITY): Payer: Self-pay | Admitting: *Deleted

## 2015-02-01 DIAGNOSIS — Z79899 Other long term (current) drug therapy: Secondary | ICD-10-CM | POA: Diagnosis not present

## 2015-02-01 DIAGNOSIS — I10 Essential (primary) hypertension: Secondary | ICD-10-CM | POA: Diagnosis not present

## 2015-02-01 DIAGNOSIS — G4730 Sleep apnea, unspecified: Secondary | ICD-10-CM | POA: Diagnosis not present

## 2015-02-01 DIAGNOSIS — E780 Pure hypercholesterolemia: Secondary | ICD-10-CM | POA: Diagnosis not present

## 2015-02-01 DIAGNOSIS — K439 Ventral hernia without obstruction or gangrene: Secondary | ICD-10-CM | POA: Insufficient documentation

## 2015-02-01 HISTORY — DX: Pure hypercholesterolemia, unspecified: E78.00

## 2015-02-01 HISTORY — PX: UMBILICAL HERNIA REPAIR: SHX196

## 2015-02-01 LAB — GLUCOSE, CAPILLARY: GLUCOSE-CAPILLARY: 135 mg/dL — AB (ref 65–99)

## 2015-02-01 SURGERY — REPAIR, HERNIA, UMBILICAL, LAPAROSCOPIC
Anesthesia: General | Site: Abdomen

## 2015-02-01 MED ORDER — GLYCOPYRROLATE 0.2 MG/ML IJ SOLN
INTRAMUSCULAR | Status: AC
  Filled 2015-02-01: qty 4

## 2015-02-01 MED ORDER — BUPIVACAINE LIPOSOME 1.3 % IJ SUSP
INTRAMUSCULAR | Status: DC | PRN
  Administered 2015-02-01: 50 mL

## 2015-02-01 MED ORDER — PROPOFOL 10 MG/ML IV BOLUS
INTRAVENOUS | Status: AC
  Filled 2015-02-01: qty 20

## 2015-02-01 MED ORDER — GLYCOPYRROLATE 0.2 MG/ML IJ SOLN
INTRAMUSCULAR | Status: DC | PRN
  Administered 2015-02-01: .8 mg via INTRAVENOUS

## 2015-02-01 MED ORDER — ARTIFICIAL TEARS OP OINT
TOPICAL_OINTMENT | OPHTHALMIC | Status: DC | PRN
  Administered 2015-02-01: 1 via OPHTHALMIC

## 2015-02-01 MED ORDER — ONDANSETRON HCL 4 MG/2ML IJ SOLN
INTRAMUSCULAR | Status: DC | PRN
  Administered 2015-02-01: 4 mg via INTRAVENOUS

## 2015-02-01 MED ORDER — FENTANYL CITRATE (PF) 250 MCG/5ML IJ SOLN
INTRAMUSCULAR | Status: AC
  Filled 2015-02-01: qty 5

## 2015-02-01 MED ORDER — ACETAMINOPHEN 325 MG PO TABS
650.0000 mg | ORAL_TABLET | ORAL | Status: DC | PRN
Start: 2015-02-01 — End: 2015-02-01
  Filled 2015-02-01: qty 2

## 2015-02-01 MED ORDER — KETOROLAC TROMETHAMINE 30 MG/ML IJ SOLN
30.0000 mg | Freq: Four times a day (QID) | INTRAMUSCULAR | Status: DC
  Filled 2015-02-01: qty 1

## 2015-02-01 MED ORDER — ROCURONIUM BROMIDE 50 MG/5ML IV SOLN
INTRAVENOUS | Status: AC
  Filled 2015-02-01: qty 2

## 2015-02-01 MED ORDER — MIDAZOLAM HCL 2 MG/2ML IJ SOLN
INTRAMUSCULAR | Status: AC
  Filled 2015-02-01: qty 2

## 2015-02-01 MED ORDER — PROMETHAZINE HCL 25 MG/ML IJ SOLN: 6.2500 mg | INTRAMUSCULAR | Status: DC | PRN

## 2015-02-01 MED ORDER — MORPHINE SULFATE 2 MG/ML IJ SOLN: 1.0000 mg | INTRAMUSCULAR | Status: DC | PRN

## 2015-02-01 MED ORDER — SODIUM CHLORIDE 0.9 % IJ SOLN: 3.0000 mL | Freq: Two times a day (BID) | INTRAMUSCULAR | Status: DC

## 2015-02-01 MED ORDER — KETOROLAC TROMETHAMINE 30 MG/ML IJ SOLN
INTRAMUSCULAR | Status: DC | PRN
  Administered 2015-02-01: 30 mg via INTRAVENOUS

## 2015-02-01 MED ORDER — ONDANSETRON HCL 4 MG/2ML IJ SOLN
INTRAMUSCULAR | Status: AC
  Filled 2015-02-01: qty 2

## 2015-02-01 MED ORDER — SODIUM CHLORIDE 0.9 % IV SOLN: 250.0000 mL | INTRAVENOUS | Status: DC | PRN

## 2015-02-01 MED ORDER — SODIUM CHLORIDE 0.9 % IJ SOLN
INTRAMUSCULAR | Status: DC | PRN
Start: 2015-02-01 — End: 2015-02-01
  Administered 2015-02-01: 30 mL

## 2015-02-01 MED ORDER — LIDOCAINE HCL (CARDIAC) 20 MG/ML IV SOLN
INTRAVENOUS | Status: DC | PRN
  Administered 2015-02-01: 100 mg via INTRAVENOUS

## 2015-02-01 MED ORDER — BUPIVACAINE-EPINEPHRINE (PF) 0.5% -1:200000 IJ SOLN
INTRAMUSCULAR | Status: AC
  Filled 2015-02-01: qty 30

## 2015-02-01 MED ORDER — PROPOFOL 10 MG/ML IV BOLUS
INTRAVENOUS | Status: DC | PRN
  Administered 2015-02-01: 20 mg via INTRAVENOUS
  Administered 2015-02-01: 200 mg via INTRAVENOUS

## 2015-02-01 MED ORDER — FENTANYL CITRATE (PF) 100 MCG/2ML IJ SOLN
25.0000 ug | INTRAMUSCULAR | Status: DC | PRN
  Administered 2015-02-01 (×2): 25 ug via INTRAVENOUS

## 2015-02-01 MED ORDER — CHLORHEXIDINE GLUCONATE 4 % EX LIQD: 1.0000 "application " | Freq: Once | CUTANEOUS | Status: DC

## 2015-02-01 MED ORDER — OXYCODONE HCL 5 MG PO TABS: 5.0000 mg | ORAL_TABLET | ORAL | Status: DC | PRN

## 2015-02-01 MED ORDER — 0.9 % SODIUM CHLORIDE (POUR BTL) OPTIME
TOPICAL | Status: DC | PRN
  Administered 2015-02-01: 1000 mL

## 2015-02-01 MED ORDER — FENTANYL CITRATE (PF) 100 MCG/2ML IJ SOLN
INTRAMUSCULAR | Status: DC | PRN
Start: 2015-02-01 — End: 2015-02-01
  Administered 2015-02-01: 100 ug via INTRAVENOUS
  Administered 2015-02-01: 50 ug via INTRAVENOUS
  Administered 2015-02-01: 100 ug via INTRAVENOUS

## 2015-02-01 MED ORDER — MIDAZOLAM HCL 5 MG/5ML IJ SOLN
INTRAMUSCULAR | Status: DC | PRN
  Administered 2015-02-01 (×3): 1 mg via INTRAVENOUS

## 2015-02-01 MED ORDER — PHENYLEPHRINE 40 MCG/ML (10ML) SYRINGE FOR IV PUSH (FOR BLOOD PRESSURE SUPPORT)
PREFILLED_SYRINGE | INTRAVENOUS | Status: AC
  Filled 2015-02-01: qty 10

## 2015-02-01 MED ORDER — SODIUM CHLORIDE 0.9 % IR SOLN
Status: DC | PRN
  Administered 2015-02-01: 500 mL

## 2015-02-01 MED ORDER — DEXTROSE 5 % IV SOLN
INTRAVENOUS | Status: DC | PRN
  Administered 2015-02-01: 08:00:00 via INTRAVENOUS

## 2015-02-01 MED ORDER — ACETAMINOPHEN 650 MG RE SUPP
650.0000 mg | RECTAL | Status: DC | PRN
  Filled 2015-02-01: qty 1

## 2015-02-01 MED ORDER — PHENYLEPHRINE HCL 10 MG/ML IJ SOLN
INTRAMUSCULAR | Status: DC | PRN
  Administered 2015-02-01: 80 ug via INTRAVENOUS

## 2015-02-01 MED ORDER — SODIUM CHLORIDE 0.9 % IJ SOLN: 3.0000 mL | INTRAMUSCULAR | Status: DC | PRN

## 2015-02-01 MED ORDER — MEPERIDINE HCL 25 MG/ML IJ SOLN: 6.2500 mg | INTRAMUSCULAR | Status: DC | PRN

## 2015-02-01 MED ORDER — ARTIFICIAL TEARS OP OINT
TOPICAL_OINTMENT | OPHTHALMIC | Status: AC
  Filled 2015-02-01: qty 3.5

## 2015-02-01 MED ORDER — LIDOCAINE HCL (CARDIAC) 20 MG/ML IV SOLN
INTRAVENOUS | Status: AC
  Filled 2015-02-01: qty 5

## 2015-02-01 MED ORDER — LACTATED RINGERS IV SOLN
INTRAVENOUS | Status: DC | PRN
  Administered 2015-02-01 (×2): via INTRAVENOUS

## 2015-02-01 MED ORDER — BUPIVACAINE LIPOSOME 1.3 % IJ SUSP
20.0000 mL | Freq: Once | INTRAMUSCULAR | Status: DC
  Filled 2015-02-01: qty 20

## 2015-02-01 MED ORDER — FENTANYL CITRATE (PF) 100 MCG/2ML IJ SOLN
INTRAMUSCULAR | Status: AC
  Administered 2015-02-01: 25 ug via INTRAVENOUS
  Filled 2015-02-01: qty 2

## 2015-02-01 MED ORDER — BUPIVACAINE-EPINEPHRINE 0.5% -1:200000 IJ SOLN
INTRAMUSCULAR | Status: DC | PRN
  Administered 2015-02-01: 5 mL

## 2015-02-01 MED ORDER — NEOSTIGMINE METHYLSULFATE 10 MG/10ML IV SOLN
INTRAVENOUS | Status: DC | PRN
  Administered 2015-02-01: 5 mg via INTRAVENOUS

## 2015-02-01 MED ORDER — ROCURONIUM BROMIDE 100 MG/10ML IV SOLN
INTRAVENOUS | Status: DC | PRN
  Administered 2015-02-01: 10 mg via INTRAVENOUS
  Administered 2015-02-01: 40 mg via INTRAVENOUS
  Administered 2015-02-01: 10 mg via INTRAVENOUS

## 2015-02-01 MED ORDER — NEOSTIGMINE METHYLSULFATE 10 MG/10ML IV SOLN
INTRAVENOUS | Status: AC
  Filled 2015-02-01: qty 1

## 2015-02-01 MED ORDER — ACETAMINOPHEN 10 MG/ML IV SOLN
1000.0000 mg | INTRAVENOUS | Status: AC
  Administered 2015-02-01: 1000 mg via INTRAVENOUS
  Filled 2015-02-01: qty 100

## 2015-02-01 SURGICAL SUPPLY — 63 items
ADH SKN CLS APL DERMABOND .7 (GAUZE/BANDAGES/DRESSINGS) ×1
APPLIER CLIP LOGIC TI 5 (MISCELLANEOUS) IMPLANT
APR CLP MED LRG 33X5 (MISCELLANEOUS)
BINDER ABDOMINAL 12 ML 46-62 (SOFTGOODS) ×2 IMPLANT
BLADE SURG 10 STRL SS (BLADE) ×2 IMPLANT
BLADE SURG ROTATE 9660 (MISCELLANEOUS) ×2 IMPLANT
CANISTER SUCTION 2500CC (MISCELLANEOUS) IMPLANT
CHLORAPREP W/TINT 26ML (MISCELLANEOUS) ×3 IMPLANT
COVER SURGICAL LIGHT HANDLE (MISCELLANEOUS) ×3 IMPLANT
DERMABOND ADVANCED (GAUZE/BANDAGES/DRESSINGS) ×2
DERMABOND ADVANCED .7 DNX12 (GAUZE/BANDAGES/DRESSINGS) IMPLANT
DEVICE SECURE STRAP 25 ABSORB (INSTRUMENTS) ×3 IMPLANT
DEVICE TROCAR PUNCTURE CLOSURE (ENDOMECHANICALS) ×3 IMPLANT
DRAPE INCISE IOBAN 66X45 STRL (DRAPES) ×3 IMPLANT
DRAPE LAPAROSCOPIC ABDOMINAL (DRAPES) ×1 IMPLANT
ELECT CAUTERY BLADE 6.4 (BLADE) ×2 IMPLANT
ELECT REM PT RETURN 9FT ADLT (ELECTROSURGICAL) ×3
ELECTRODE REM PT RTRN 9FT ADLT (ELECTROSURGICAL) ×1 IMPLANT
GLOVE BIO SURGEON STRL SZ7 (GLOVE) ×2 IMPLANT
GLOVE BIO SURGEON STRL SZ7.5 (GLOVE) ×2 IMPLANT
GLOVE BIOGEL M STRL SZ7.5 (GLOVE) ×2 IMPLANT
GLOVE BIOGEL PI IND STRL 7.0 (GLOVE) IMPLANT
GLOVE BIOGEL PI IND STRL 7.5 (GLOVE) IMPLANT
GLOVE BIOGEL PI INDICATOR 7.0 (GLOVE) ×2
GLOVE BIOGEL PI INDICATOR 7.5 (GLOVE) ×2
GLOVE ECLIPSE 7.0 STRL STRAW (GLOVE) ×2 IMPLANT
GLOVE ECLIPSE 7.5 STRL STRAW (GLOVE) ×2 IMPLANT
GOWN STRL REUS W/ TWL LRG LVL3 (GOWN DISPOSABLE) ×2 IMPLANT
GOWN STRL REUS W/ TWL XL LVL3 (GOWN DISPOSABLE) ×1 IMPLANT
GOWN STRL REUS W/TWL LRG LVL3 (GOWN DISPOSABLE) ×6
GOWN STRL REUS W/TWL XL LVL3 (GOWN DISPOSABLE) ×3
KIT BASIN OR (CUSTOM PROCEDURE TRAY) ×3 IMPLANT
KIT ROOM TURNOVER OR (KITS) ×3 IMPLANT
LIQUID BAND (GAUZE/BANDAGES/DRESSINGS) ×3 IMPLANT
MARKER SKIN DUAL TIP RULER LAB (MISCELLANEOUS) ×3 IMPLANT
MESH VENTRALIGHT ST 4.5IN (Mesh General) ×2 IMPLANT
NDL HYPO 25GX1X1/2 BEV (NEEDLE) IMPLANT
NDL SPNL 22GX3.5 QUINCKE BK (NEEDLE) IMPLANT
NEEDLE HYPO 25GX1X1/2 BEV (NEEDLE) ×3 IMPLANT
NEEDLE SPNL 22GX3.5 QUINCKE BK (NEEDLE) ×3 IMPLANT
NS IRRIG 1000ML POUR BTL (IV SOLUTION) ×3 IMPLANT
PAD ARMBOARD 7.5X6 YLW CONV (MISCELLANEOUS) ×6 IMPLANT
PENCIL BUTTON HOLSTER BLD 10FT (ELECTRODE) ×2 IMPLANT
SCALPEL HARMONIC ACE (MISCELLANEOUS) IMPLANT
SCISSORS LAP 5X35 DISP (ENDOMECHANICALS) IMPLANT
SET IRRIG TUBING LAPAROSCOPIC (IRRIGATION / IRRIGATOR) IMPLANT
SLEEVE ENDOPATH XCEL 5M (ENDOMECHANICALS) ×3 IMPLANT
SUT MNCRL AB 4-0 PS2 18 (SUTURE) ×2 IMPLANT
SUT MON AB 5-0 PS2 18 (SUTURE) ×3 IMPLANT
SUT NOVA NAB DX-16 0-1 5-0 T12 (SUTURE) ×4 IMPLANT
SUT NOVA NAB GS-21 1 T12 (SUTURE) ×3 IMPLANT
SUT VIC AB 3-0 SH 18 (SUTURE) ×2 IMPLANT
SYR BULB IRRIGATION 50ML (SYRINGE) ×2 IMPLANT
TOWEL OR 17X24 6PK STRL BLUE (TOWEL DISPOSABLE) ×3 IMPLANT
TRAY FOLEY CATH 16FR SILVER (SET/KITS/TRAYS/PACK) IMPLANT
TRAY LAPAROSCOPIC MC (CUSTOM PROCEDURE TRAY) ×3 IMPLANT
TROCAR XCEL BLUNT TIP 100MML (ENDOMECHANICALS) IMPLANT
TROCAR XCEL NON-BLD 11X100MML (ENDOMECHANICALS) ×3 IMPLANT
TROCAR XCEL NON-BLD 5MMX100MML (ENDOMECHANICALS) ×3 IMPLANT
TUBE CONNECTING 12'X1/4 (SUCTIONS) ×1
TUBE CONNECTING 12X1/4 (SUCTIONS) ×1 IMPLANT
TUBING INSUFFLATION (TUBING) ×3 IMPLANT
YANKAUER SUCT BULB TIP NO VENT (SUCTIONS) ×2 IMPLANT

## 2015-02-01 NOTE — Progress Notes (Signed)
Assisted pt to bathroom, unable to void at this time.  C/o nausea with small amt emesis noted.

## 2015-02-01 NOTE — H&P (View-Only) (Signed)
Cori Kuenzel 12/06/2014 2:58 PM Location: [facility] Patient #: [id number] DOB: 04/27/1961 Married / Language: English / Race: Black or African American Male  History of Present Illness (Aaren Krog M. Keala Drum MD; 12/06/2014 5:42 PM) Patient words: discuss surgery.  The patient is a 54 year old male who presents with an incisional hernia. I initially saw him several weeks ago for a second opinion regarding his supraumbilical hernia. Since that time he underwent a CT scan and comes back in today to discuss results of his CT scan. He denies any changes other than pulling a left groin muscle 2 days ago. He states his discomfort is getting better. He did see his primary care physician and was put on muscle relaxant. He denies any fever, chills, nausea, vomiting, diarrhea or constipation. He denies any chest pain, chest pressure, shortness of breath.   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  Diagnostic Studies History (Cartier Mapel M Lauria Depoy, MD; 12/06/2014 5:44 PM) Colonoscopy 1-5 years ago  Allergies (Ammie Eversole, LPN; 12/06/2014 2:59 PM) No Known Drug Allergies04/14/2016  Medication History (Ammie Eversole, LPN; 12/06/2014 3:00 PM) Spironolactone-HCTZ (25-25MG Tablet, Oral) Active. Atorvastatin Calcium (10MG Tablet, Oral) Active. Flexeril (10MG Tablet, Oral) Active. TraMADol HCl ER (100MG Tablet ER 24HR, Oral) Active.  Social History (Alsie Younes M Kaleyah Labreck, MD; 12/06/2014 5:44 PM) No drug use Caffeine use Tea. Alcohol use Occasional alcohol use. Tobacco use Never smoker.  Family History (Barnard Sharps M  Cherryl Babin, MD; 12/06/2014 5:44 PM) Hypertension Mother.  Review of Systems (Waniya Hoglund M Seith Aikey, MD; 12/06/2014 5:41 00) General Not Present- Appetite Loss, Chills, Fatigue, Fever, Night Sweats, Weight Gain and Weight Loss. Skin Not Present- Change in Wart/Mole, Dryness, Hives, Jaundice, New Lesions, Non-Healing Wounds, Rash and Ulcer. HEENT Not Present- Earache, Hearing Loss, Hoarseness, Nose Bleed, Oral Ulcers, Ringing in the Ears, Seasonal Allergies, Sinus Pain, Sore Throat, Visual Disturbances, Wears glasses/contact lenses and Yellow Eyes. Respiratory Not Present- Bloody sputum, Chronic Cough, Difficulty Breathing, Snoring and Wheezing. Breast Not Present- Breast Mass, Breast Pain, Nipple Discharge and Skin Changes. Cardiovascular Not Present- Chest Pain, Difficulty Breathing Lying Down, Leg Cramps, Palpitations, Rapid Heart Rate, Shortness of Breath and Swelling of Extremities. Gastrointestinal Not Present- Abdominal Pain, Bloating, Bloody Stool, Change in Bowel Habits, Chronic diarrhea, Constipation, Difficulty Swallowing, Excessive gas, Gets full quickly at meals, Hemorrhoids, Indigestion, Nausea, Rectal Pain and Vomiting. Male Genitourinary Not Present- Blood in Urine, Change in Urinary Stream, Frequency, Impotence, Nocturia, Painful Urination, Urgency and Urine Leakage.   Vitals (Ammie Eversole LPN; 12/06/2014 2:59 PM) 12/06/2014 2:58 PM Weight: 275 lb Height: 72in Body Surface Area: 2.52 m Body Mass Index: 37.3 kg/m Pulse: 88 (Regular)  BP: 152/98 (Sitting, Left Arm, Standard)    Physical Exam (Clodagh Odenthal M. Tag Wurtz MD; 12/06/2014 5:41 PM) General Mental Status-Alert. General Appearance-Consistent with stated age. Hydration-Well hydrated. Voice-Normal. Note: obese   Head and Neck Head-normocephalic, atraumatic with no lesions or palpable masses. Trachea-midline. Thyroid Gland Characteristics - normal size and consistency.  Eye Eyeball - Bilateral-Extraocular  movements intact. Sclera/Conjunctiva - Bilateral-No scleral icterus.  Chest and Lung Exam Chest and lung exam reveals -quiet, even and easy respiratory effort with no use of accessory muscles and on auscultation, normal breath   sounds, no adventitious sounds and normal vocal resonance. Inspection Chest Wall - Normal. Back - normal.  Breast - Did not examine.  Cardiovascular Cardiovascular examination reveals -normal heart sounds, regular rate and rhythm with no murmurs and normal pedal pulses bilaterally.  Abdomen Inspection  Inspection of the abdomen reveals: Note: has upper midline diastasis; just above umbilicus there is a small fascial defect in the supraumbilical position probably about 2 cm; at the old infraumbilical incision there seems to be a possible small defect or so. no cellulitis, induration, fluctuance. some tenderness to deep palpation. Skin - Scar - Note: transverse infraumbilical incision. Palpation/Percussion Palpation and Percussion of the abdomen reveal - Soft, Non Tender, No Rebound tenderness, No Rigidity (guarding) and No hepatosplenomegaly. Auscultation Auscultation of the abdomen reveals - Bowel sounds normal.  Peripheral Vascular Upper Extremity Palpation - Pulses bilaterally normal.  Neurologic Neurologic evaluation reveals -alert and oriented x 3 with no impairment of recent or remote memory. Mental Status-Normal.  Neuropsychiatric The patient's mood and affect are described as -normal. Judgment and Insight-insight is appropriate concerning matters relevant to self.  Musculoskeletal Normal Exam - Left-Upper Extremity Strength Normal and Lower Extremity Strength Normal. Normal Exam - Right-Upper Extremity Strength Normal and Lower Extremity Strength Normal.  Lymphatic Head & Neck  General Head & Neck Lymphatics: Bilateral - Description - Normal. Axillary - Did not examine. Femoral & Inguinal - Did not examine.    Assessment &  Plan Minerva Areola(Rider Ermis M. Jahleah Mariscal MD; 12/06/2014 5:44 PM) SUPRAUMBILICAL HERNIA (553.9  K43.9) Impression: After reviewing his CT scan he does have a small supraumbilical hernia. We discussed observation versus surgical repair. He is interested in surgical repair. We discussed risk and benefits extensively. They were provided education. He was accompanied by his wife today. We discussed different surgical approaches such as open versus laparoscopic versus laparoscopic assisted. We talked about the pros and cons of mesh insertion. With respect to his job and line of work and do believe he needs his muscle reapproximated. I proposed doing a laparoscopic assisted supraumbilical hernia repair with mesh. We would sew the muscle back together primarily open and then used laparoscopy to put a mesh underlay to reinforce the repair. He has elected to proceed with surgery. The risk and benefits and typical postoperative course were explained in detail and documented in his patient education handout Current Plans  Schedule for Surgery Pt Education - [facility] Free Text Education/Instructions: discussed with patient and provided information. 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 Andrey CampanileWilson, MD, FACS General, Bariatric, & Minimally Invasive Surgery [facility] [place]

## 2015-02-01 NOTE — Transfer of Care (Signed)
Immediate Anesthesia Transfer of Care Note  Patient: Colton Powell  Procedure(s) Performed: Procedure(s): LAPAROSCOPIC ASSISTED REPAIR OF SUPRAUMBILICAL HERNIA REPAIR WITH MESH (N/A)  Patient Location: PACU  Anesthesia Type:General  Level of Consciousness: awake, sedated and patient cooperative  Airway & Oxygen Therapy: Patient Spontanous Breathing and Patient connected to face mask oxygen  Post-op Assessment: Report given to RN and Post -op Vital signs reviewed and stable  Post vital signs: Reviewed and stable  Last Vitals:  Filed Vitals:   02/01/15 0615  BP: 138/102  Pulse: 72  Temp: 36.7 C  Resp: 20    Complications: No apparent anesthesia complications

## 2015-02-01 NOTE — Op Note (Signed)
Colton Powell August 18, 1960 [id number] 02/01/2015  Laparoscopic Assisted Supraumbilical Hernia Repair with Mesh Procedure Note  Indications: Symptomatic supraumbilical hernia, possible recurrent umbilical hernia. Please see my h&p notes.   Pre-operative Diagnosis: Symptomatic supraumbilical hernia, possible recurrent umbilical hernia  Post-operative Diagnosis: supraumbilical hernia  Surgeon: Atilano Ina   Assistants: Shirley Friar, RNFA  Anesthesia: General endotracheal anesthesia  ASA Class: 2  Procedure Details  The patient was seen in the Holding Room. The risks, benefits, complications, treatment options, and expected outcomes were discussed with the patient. The possibilities of reaction to medication, pulmonary aspiration, perforation of viscus, bleeding, recurrent infection, the need for additional procedures, failure to diagnose a condition, and creating a complication requiring transfusion or operation were discussed with the patient. The patient concurred with the proposed plan, giving informed consent.  The site of surgery properly noted/marked. The patient was taken to the operating room, identified as Colton Powell and the procedure verified as laparoscopic assisted ventral hernia repair with mesh. A Time Out was held and the above information confirmed.  He received IV antibiotic prior to skin incision  The patient was placed supine.  After establishing general anesthesia, the abdomen was prepped with Chloraprep and draped in standard fashion with ioban.  A 5 mm Optiview was used the cannulate the peritoneal cavity in the left upper quadrant below the costal margin.  Pneumoperitoneum was obtained by insufflating CO2, maintaining a maximum pressure of 15 mmHg.  The 5 mm 30-degree laparoscopic was inserted.  There were no significant omental adhesions to the anterior abdominal wall in and around the hernia defect.  An 11-mm port was placed in the left anterior axillary line at the  level of the umbilicus. The falciform ligament extended down to the palpable supraumbilical fascial defect. Using [employer] with cautery I took down the falciform ligament. I then removed the plug of adipose tissue from the supraumbilical fascial defect. The prior infraumbilical-umbilical hernia repair appeared intact. There is no evidence of a fascial defect at that location. There is no indication of other fascial defects.   A curvilinear infraumbilical incision was created excising his old infraumbilical incisional scar. Dissection was carried down. There was obvious prior evidence of surgery in this area but no purulence encountered. I released the umbilical stalk.  The prior umbilical fascia repair appeared intact. There is no evidence of a palpable or visual defect in this location. It was well scarred in. The supraumbilical fascial defect was visualized. It was approximately 1 cm in size. Skin and soft tissue was mobilized from the surface of the fascia in a circumferential manner. The fascia was then closed primarily and transversely with 4 interrupted 0-novafil sutures. The cavity was irrigated with antibiotic irrigation and Exparel was infiltrated in the subcutaneous tissue and fascia. The umbilical stalk was then tacked back down to the fascia with two 3-0 vicryl sutures. Hemostasis was confirmed. The soft tissue was irrigated and closed in layers with inverted interrupted 3-0 vicryl sutures for the deep dermis.   We then returned laparoscopically. I selected a 4.5 inch piece of round Bard Ventralight ST mesh.  We placed 4 stay sutures of 0 Novofil around the edges of the mesh.  The mesh was then rolled up and inserted through the 11 mm port site.  The mesh was then unrolled.  The stay sutures were then pulled up through small stab incisions using the Endo-close device.  This deployed the mesh widely over the fascial defect that had been closed. There was overlap of  the supraumbilical fascial defect  and the previously repaired umbilical hernia site.  Exparel was then infiltrated in the preperitoneal space around the transfascial suture sites and around the anticipitated location of the tacks. The stay sutures were then tied down.  The Secure Strap device was then used to tack down the edges of the mesh at 1 cm intervals circumferentially.  We placed a few tacks inside the outer ring of tacks.  We inspected for hemostasis.  Pneumoperitoneum was then released as we removed the remainder of the trocars.  The port sites were closed with 4-0 Monocryl.  All of the incisions and stay suture sites were then sealed with Dermabond.  An abdominal binder was placed around the patient's abdomen.  The patient was extubated and brought to the recovery room in stable condition.  All sponge, instrument, and needle counts were correct prior to closure and at the conclusion of the case.   Findings: Type of repair - primary suture with mesh underlay  (choices - primary suture, mesh, or component)  Name of mesh - Bard Ventralight St  Size of mesh - round 4.5 INCH  Mesh overlap - 5 cm  Placement of mesh -  beneath fascia and into peritoneal cavity,  (choices - beneath fascia and into peritoneal cavity, beneath fascia but external to peritoneal cavity, between the muscle and fascia, above or external to fascia)  Estimated Blood Loss:  Minimal         Complications:  None; patient tolerated the procedure well.         Disposition: PACU - hemodynamically stable.         Condition: stable

## 2015-02-01 NOTE — Anesthesia Postprocedure Evaluation (Signed)
  Anesthesia Post-op Note  Patient: Colton Powell  Procedure(s) Performed: Procedure(s): LAPAROSCOPIC ASSISTED REPAIR OF SUPRAUMBILICAL HERNIA REPAIR WITH MESH (N/A)  Patient Location: PACU  Anesthesia Type:General  Level of Consciousness: awake, alert  and oriented  Airway and Oxygen Therapy: Patient Spontanous Breathing and Patient connected to nasal cannula oxygen  Post-op Pain: mild  Post-op Assessment: Post-op Vital signs reviewed, Patient's Cardiovascular Status Stable, Respiratory Function Stable, Patent Airway and No signs of Nausea or vomiting              Post-op Vital Signs: Reviewed and stable  Last Vitals:  Filed Vitals:   02/01/15 1030  BP: 124/77  Pulse: 80  Temp:   Resp: 18    Complications: No apparent anesthesia complications

## 2015-02-01 NOTE — Discharge Instructions (Signed)
[facility]  UMBILICAL OR INGUINAL HERNIA REPAIR: POST OP INSTRUCTIONS  Always review your discharge instWashingtonruction sheet given to you by the facility where your surgery was performed. IF YOU HAVE DISABILITY OR FAMILY LEAVE FORMS, YOU MUST BRING THEM TO THE OFFICE FOR PROCESSING.   DO NOT GIVE THEM TO YOUR DOCTOR.  1. A  prescription for pain medication may be given to you upon discharge.  Take your pain medication as prescribed, if needed.   You may take acetaminophen (Tylenol) &/or ibuprofen (Advil) as well - follow package directions. 2. Take your usually prescribed medications unless otherwise directed. 3. If you need a refill on your pain medication, please contact your pharmacy.  They will contact our office to request authorization. Prescriptions will not be filled after 5 pm or on week-ends. 4. You should follow a light diet the first 24 hours after arrival home, such as soup and crackers, etc.  Be sure to include lots of fluids daily.  Resume your normal diet the day after surgery. 5. Most patients will experience some swelling and bruising around the umbilicus or in the groin and scrotum.  Ice packs and reclining will help.  Swelling and bruising can take several days to resolve.  6. It is common to experience some constipation if taking pain medication after surgery.  Increasing fluid intake and taking a stool softener (such as Colace) will usually help or prevent this problem from occurring.  A mild laxative (Milk of Magnesia or Miralax) should be taken according to package directions if there are no bowel movements after 48 hours. 7. If your surgeon used skin glue on the incision, you may shower in 24 hours.  The glue will flake off over the next 2-3 weeks.  Any sutures or staples will be removed at the office during your follow-up visit. 8. ACTIVITIES:  You may resume regular (light) daily activities beginning the next day--such as daily self-care, walking, climbing  stairs--gradually increasing activities as tolerated.  You may have sexual intercourse when it is comfortable.  Refrain from any heavy lifting or straining until approved by your doctor. a. You may drive when you are no longer taking prescription pain medication, you can comfortably wear a seatbelt, and you can safely maneuver your car and apply brakes. b. RETURN TO WORK:  9. You should see your doctor in the office for a follow-up appointment approximately 2-3 weeks after your surgery.  Make sure that you call for this appointment within a day or two after you arrive home to insure a convenient appointment time. 10. OTHER INSTRUCTIONS: DO NOT LIFT, PUSH, OR PULL ANYTHING GREATER THAN 10 POUNDS FOR 6 WEEKS. 11. WEAR ABDOMINAL BINDER DURING DAYTIME    WHEN TO CALL YOUR DOCTOR: 1. Fever over 101.0 2. Inability to urinate 3. Nausea and/or vomiting 4. Extreme swelling or bruising 5. Continued bleeding from incision. 6. Increased pain, redness, or drainage from the incision  The clinic staff is available to answer your questions during regular business hours.  Please dont hesitate to call and ask to speak to one of the nurses for clinical concerns.  If you have a medical emergency, go to the nearest emergency room or call 911.  [facility] from [facility] is always on call at the hospital   [address], [address], [place], [place]  [address] ?  [address] [address], [place], [place]   [address] [phone] ? [phone] ? FAX [phone] Web site: [email or web address]

## 2015-02-01 NOTE — Anesthesia Procedure Notes (Signed)
Procedure Name: Intubation Date/Time: 02/01/2015 7:41 AM Performed by: Marni GriffonJAMES, Liza Czerwinski B Pre-anesthesia Checklist: Emergency Drugs available, Patient identified, Suction available and Patient being monitored Patient Re-evaluated:Patient Re-evaluated prior to inductionOxygen Delivery Method: Circle system utilized Preoxygenation: Pre-oxygenation with 100% oxygen Intubation Type: IV induction Ventilation: Mask ventilation without difficulty Laryngoscope Size: Mac and 4 Grade View: Grade I Tube type: Oral Tube size: 7.5 mm Number of attempts: 1 Airway Equipment and Method: Stylet Placement Confirmation: ETT inserted through vocal cords under direct vision,  breath sounds checked- equal and bilateral and positive ETCO2 Secured at: 22 (cm at teeth) cm Tube secured with: Tape Dental Injury: Teeth and Oropharynx as per pre-operative assessment

## 2015-02-01 NOTE — Addendum Note (Signed)
Addendum  created 02/01/15 1604 by Marni GriffonKaren B Savahna Casados, CRNA   Modules edited: Anesthesia Attestations

## 2015-02-01 NOTE — Interval H&P Note (Signed)
History and Physical Interval Note:  02/01/2015 7:26 AM  Colton Powell  has presented today for surgery, with the diagnosis of Supra Umbilical Hernia  The various methods of treatment have been discussed with the patient and family. After consideration of risks, benefits and other options for treatment, the patient has consented to  Procedure(s): LAPAROSCOPIC ASSISTED REPAIR OF SUPRAUMBILICAL HERNIA REPAIR WITH MESH (N/A) as a surgical intervention .  The patient's history has been reviewed, patient examined, no change in status, stable for surgery.  I have reviewed the patient's chart and labs.  Questions were answered to the patient's satisfaction.    Rash on LLE is clearing up. No n/v/f/c/cp/sob. Since has been on light duty/no heavy lifting hasn't had any periumbilical pain. But when was doing normal activities had supraumbilical discomfort.   Alert, nad, nontoxic Symmetric chest rise Reg pulse Soft, obese, nontender, small supraumbilical fascial defect A few scattered bumps on LLE - no cellulitis  Mary SellaEric M. Andrey CampanileWilson, MD, FACS General, Bariatric, & Minimally Invasive Surgery [facility] [place]   [facility]ILSON,Selin Eisler M

## 2015-02-01 NOTE — Progress Notes (Signed)
Care of pt [facility] by [facility] Waiting on a Short Stay bed to be available.

## 2015-02-04 ENCOUNTER — Encounter (HOSPITAL_COMMUNITY): Payer: Self-pay | Admitting: General Surgery

## 2015-10-29 ENCOUNTER — Other Ambulatory Visit: Payer: Self-pay | Admitting: Family Medicine

## 2015-10-29 ENCOUNTER — Ambulatory Visit
Admission: RE | Admit: 2015-10-29 | Discharge: 2015-10-29 | Disposition: A | Discharge status: Home / Self Care | Payer: Worker's Compensation | Source: Ambulatory Visit | Attending: Family Medicine | Admitting: Family Medicine

## 2015-10-29 DIAGNOSIS — T148XXA Other injury of unspecified body region, initial encounter: Secondary | ICD-10-CM

## 2017-01-25 ENCOUNTER — Encounter (HOSPITAL_COMMUNITY): Admission: RE | Payer: Self-pay | Source: Ambulatory Visit

## 2017-01-25 ENCOUNTER — Inpatient Hospital Stay (HOSPITAL_COMMUNITY): Admission: RE | Admit: 2017-01-25 | Payer: Self-pay | Source: Ambulatory Visit | Admitting: Orthopedic Surgery

## 2017-01-25 SURGERY — ARTHROPLASTY, KNEE, TOTAL
Anesthesia: Spinal | Site: Knee | Laterality: Left

## 2017-03-15 ENCOUNTER — Inpatient Hospital Stay (HOSPITAL_COMMUNITY): Admission: RE | Admit: 2017-03-15 | Payer: Self-pay | Source: Ambulatory Visit | Admitting: Orthopedic Surgery

## 2017-03-15 ENCOUNTER — Encounter (HOSPITAL_COMMUNITY): Admission: RE | Payer: Self-pay | Source: Ambulatory Visit

## 2017-03-15 SURGERY — ARTHROPLASTY, KNEE, TOTAL
Anesthesia: Spinal | Laterality: Right

## 2017-08-03 NOTE — Progress Notes (Signed)
Please place orders in Epic as patient is being scheduled for a pre-op appointment! Thank you! 

## 2017-08-20 NOTE — H&P (Signed)
TOTAL KNEE ADMISSION H&P  Patient is being admitted for left total knee arthroplasty.  Subjective:  Chief Complaint:   Left knee primary OA / pain  HPI: Colton Powell, 57 y.o. male, has a history of pain and functional disability in the left knee due to arthritis and has failed non-surgical conservative treatments for greater than 12 weeks to includeNSAID's and/or analgesics, supervised PT with diminished ADL's post treatment, use of assistive devices and activity modification.  Onset of symptoms was gradual, starting ~2 years ago with gradually worsening course since that time. The patient noted prior procedures on the knee to include  arthroscopy on the left knee(s).  Patient currently rates pain in the left knee(s) at 10 out of 10 with activity. Patient has night pain, worsening of pain with activity and weight bearing, pain that interferes with activities of daily living, pain with passive range of motion, crepitus and joint swelling.  Patient has evidence of periarticular osteophytes and joint space narrowing by imaging studies.  There is no active infection.  Risks, benefits and expectations were discussed with the patient.  Risks including but not limited to the risk of anesthesia, blood clots, nerve damage, blood vessel damage, failure of the prosthesis, infection and up to and including death.  Patient understand the risks, benefits and expectations and wishes to proceed with surgery.   PCP: System, Pcp Not In  D/C Plans:       Home   Post-op Meds:       No Rx given   Tranexamic Acid:      To be given - IV   Decadron:      Is to be given  FYI:     ASA  Norco  CPAP  DME:   Rx given for - RW   PT:   OPPT Rx given    Past Medical History:  Diagnosis Date  . High cholesterol    Takes atorvastatin  . Hypertension   . Sleep apnea    CPAP    Past Surgical History:  Procedure Laterality Date  . BACK SURGERY  2012  . CARDIAC CATHETERIZATION     09/07/11 [facility]): Near  normal coronaries, LVEF 65%  . HERNIA REPAIR  4/15  . KNEE ARTHROSCOPY Bilateral 2003  . UMBILICAL HERNIA REPAIR N/A 02/01/2015   Procedure: LAPAROSCOPIC ASSISTED REPAIR OF SUPRAUMBILICAL HERNIA REPAIR WITH MESH;  Surgeon: Gaynelle AduEric [name], MD;  [facility];  Service: General;  Laterality: N/A;    No current facility-administered medications for this encounter.    Current Outpatient Medications  Medication Sig Dispense Refill Last Dose  . aspirin EC 81 MG tablet Take 81 mg by mouth daily.     Marland Kitchen. atorvastatin (LIPITOR) 10 MG tablet Take 10 mg by mouth daily.  1 01/31/2015 at Unknown time  . hydrochlorothiazide (MICROZIDE) 12.5 MG capsule Take 25 mg by mouth daily.  1   . ibuprofen (ADVIL,MOTRIN) 800 MG tablet Take 800 mg by mouth every 8 (eight) hours as needed. for pain  0   . KLOR-CON 10 10 MEQ tablet Take 10 mEq by mouth daily.  1   . metoprolol succinate (TOPROL-XL) 100 MG 24 hr tablet Take 100 mg by mouth daily.  3   . tetrahydrozoline 0.05 % ophthalmic solution Place 1-2 drops into both eyes 3 (three) times daily as needed (for dry/irritated eyes.).      No Known Allergies   Social History   Tobacco Use  . Smoking status: Never Smoker  .  Smokeless tobacco: Never Used  Substance Use Topics  . Alcohol use: No       Review of Systems  Constitutional: Negative.   HENT: Negative.   Eyes: Negative.   Respiratory: Negative.   Cardiovascular: Negative.   Gastrointestinal: Negative.   Genitourinary: Negative.   Musculoskeletal: Positive for joint pain.  Skin: Negative.   Neurological: Negative.   Endo/Heme/Allergies: Negative.   Psychiatric/Behavioral: Negative.     Objective:  Physical Exam  Constitutional: He is oriented to person, place, and time. He appears well-developed.  HENT:  Head: Normocephalic.  Eyes: Pupils are equal, round, and reactive to light.  Neck: Neck supple. No JVD present. No tracheal deviation present. No thyromegaly present.  Cardiovascular: Normal  rate, regular rhythm and intact distal pulses.  Respiratory: Effort normal and breath sounds normal. No respiratory distress. He has no wheezes.  GI: Soft. There is no tenderness. There is no guarding.  Musculoskeletal:       Left knee: He exhibits decreased range of motion, swelling and bony tenderness. He exhibits no ecchymosis, no deformity, no laceration and no erythema. Tenderness found.  Lymphadenopathy:    He has no cervical adenopathy.  Neurological: He is alert and oriented to person, place, and time.  Skin: Skin is warm and dry.  Psychiatric: He has a normal mood and affect.      Labs:  Estimated body mass index is 37.43 kg/m as calculated from the following:   Height as of 01/24/15: 6' (1.829 m).   Weight as of 02/01/15: 125.2 kg (276 lb).   Imaging Review Plain radiographs demonstrate severe degenerative joint disease of the left knee(s).  The bone quality appears to be good for age and reported activity level.  Assessment/Plan:  End stage arthritis, left knee   The patient history, physical examination, clinical judgment of the provider and imaging studies are consistent with end stage degenerative joint disease of the left knee(s) and total knee arthroplasty is deemed medically necessary. The treatment options including medical management, injection therapy arthroscopy and arthroplasty were discussed at length. The risks and benefits of total knee arthroplasty were presented and reviewed. The risks due to aseptic loosening, infection, stiffness, patella tracking problems, thromboembolic complications and other imponderables were discussed. The patient acknowledged the explanation, agreed to proceed with the plan and consent was signed. Patient is being admitted for inpatient treatment for surgery, pain control, PT, OT, prophylactic antibiotics, VTE prophylaxis, progressive ambulation and ADL's and discharge planning. The patient is planning to be discharged  home.     Colton Auerbach Malayia Spizzirri   PA-C  08/20/2017, 1:55 PM

## 2017-08-24 NOTE — Patient Instructions (Addendum)
Colton JacquetRobert Powell  08/24/2017   Your procedure is scheduled on: 08-30-17   [facility]  [facility]trance Follow signs to Short Stay on first floor at 530 AM    Call this number if you have problems the morning of surgery [phone]   Remember: Do not eat food or drink liquids :After Midnight.     Take these medicines the morning of surgery with A SIP OF WATER: Metoprolol Succinate (Toprol-XL)              You may not have any metal on your body including hair pins and              piercings  Do not wear jewelry, lotions, powders or deodorant             Men may shave face and neck.   Do not bring valuables to the hospital. [facility] IS NOT             RESPONSIBLE   FOR VALUABLES.  Contacts, dentures or bridgework may not be worn into surgery.  Leave suitcase in the car. After surgery it may be brought to your room.   Please bring your mask and tubing for your CPAP machine               Please read over the following fact sheets you were given: _____________________________________________________________________          [facility] - Preparing for [facility] Before surgery, you can play an important role.  Because skin is not sterile, your skin needs to be as free of germs as possible.  You can reduce the number of germs on your skin by washing with CHG (chlorahexidine gluconate) soap before surgery.  CHG is an antiseptic cleaner which kills germs and bonds with the skin to continue killing germs even after washing. Please DO NOT use if you have an allergy to CHG or antibacterial soaps.  If your skin becomes reddened/irritated stop using the CHG and inform your nurse when you arrive at Short Stay. Do not shave (including legs and underarms) for at least 48 hours prior to the first CHG shower.  You may shave your face/neck. Please follow these instructions carefully:  1.  Shower with CHG Soap the night before surgery and the  morning of Surgery.  2.  If you  choose to wash your hair, wash your hair first as usual with your  normal  shampoo.  3.  After you shampoo, rinse your hair and body thoroughly to remove the  shampoo.                           4.  Use CHG as you would any other liquid soap.  You can apply chg directly  to the skin and wash                       Gently with a scrungie or clean washcloth.  5.  Apply the CHG Soap to your body ONLY FROM THE NECK DOWN.   Do not use on face/ open                           Wound or open sores. Avoid contact with eyes, ears mouth and genitals (private parts).  Wash face,  Genitals (private parts) with your normal soap.             6.  Wash thoroughly, paying special attention to the area where your surgery  will be performed.  7.  Thoroughly rinse your body with warm water from the neck down.  8.  DO NOT shower/wash with your normal soap after using and rinsing off  the CHG Soap.                9.  Pat yourself dry with a clean towel.            10.  Wear clean pajamas.            11.  Place clean sheets on your bed the night of your first shower and do not  sleep with pets. Day of Surgery : Do not apply any lotions/deodorants the morning of surgery.  Please wear clean clothes to the hospital/surgery center.  FAILURE TO FOLLOW THESE INSTRUCTIONS MAY RESULT IN THE CANCELLATION OF YOUR SURGERY PATIENT SIGNATURE_________________________________  NURSE SIGNATURE__________________________________  ________________________________________________________________________   [name]  An incentive spirometer is a tool that can help keep your lungs clear and active. This tool measures how well you are filling your lungs with each breath. Taking long deep breaths may help reverse or decrease the chance of developing breathing (pulmonary) problems (especially infection) following:  A long period of time when you are unable to move or be active. BEFORE THE PROCEDURE   If  the spirometer includes an indicator to show your best effort, your nurse or respiratory therapist will set it to a desired goal.  If possible, sit up straight or lean slightly forward. Try not to slouch.  Hold the incentive spirometer in an upright position. INSTRUCTIONS FOR USE  1. Sit on the edge of your bed if possible, or sit up as far as you can in bed or on a chair. 2. Hold the incentive spirometer in an upright position. 3. Breathe out normally. 4. Place the mouthpiece in your mouth and seal your lips tightly around it. 5. Breathe in slowly and as deeply as possible, raising the piston or the ball toward the top of the column. 6. Hold your breath for 3-5 seconds or for as long as possible. Allow the piston or ball to fall to the bottom of the column. 7. Remove the mouthpiece from your mouth and breathe out normally. 8. Rest for a few seconds and repeat Steps 1 through 7 at least 10 times every 1-2 hours when you are awake. Take your time and take a few normal breaths between deep breaths. 9. The spirometer may include an indicator to show your best effort. Use the indicator as a goal to work toward during each repetition. 10. After each set of 10 deep breaths, practice coughing to be sure your lungs are clear. If you have an incision (the cut made at the time of surgery), support your incision when coughing by placing a pillow or rolled up towels firmly against it. Once you are able to get out of bed, walk around indoors and cough well. You may stop using the incentive spirometer when instructed by your caregiver.  RISKS AND COMPLICATIONS  Take your time so you do not get dizzy or light-headed.  If you are in pain, you may need to take or ask for pain medication before doing incentive spirometry. It is harder to take a deep breath if you are having pain.  AFTER USE  Rest and breathe slowly and easily.  It can be helpful to keep track of a log of your progress. Your caregiver can  provide you with a simple table to help with this. If you are using the spirometer at home, follow these instructions: [place] IF:   You are having difficultly using the spirometer.  You have trouble using the spirometer as often as instructed.  Your pain medication is not giving enough relief while using the spirometer.  You develop fever of 100.5 F (38.1 C) or higher. SEEK IMMEDIATE MEDICAL CARE IF:   You cough up bloody sputum that had not been present before.  You develop fever of 102 F (38.9 C) or greater.  You develop worsening pain at or near the incision site. MAKE SURE YOU:   Understand these instructions.  Will watch your condition.  Will get help right away if you are not doing well or get worse. Document Released: 11/16/2006 Document Revised: 09/28/2011 Document Reviewed: 01/17/2007 Exit[employer] Patient Information 2014 [employer], [place].   ________________________________________________________________________  WHAT IS A BLOOD TRANSFUSION? Blood Transfusion Information  A transfusion is the replacement of blood or some of its parts. Blood is made up of multiple cells which provide different functions.  Red blood cells carry oxygen and are used for blood loss replacement.  White blood cells fight against infection.  Platelets control bleeding.  Plasma helps clot blood.  Other blood products are available for specialized needs, such as hemophilia or other clotting disorders. BEFORE THE TRANSFUSION  Who gives blood for transfusions?   Healthy volunteers who are fully evaluated to make sure their blood is safe. This is blood bank blood. Transfusion therapy is the safest it has ever been in the practice of medicine. Before blood is taken from a donor, a complete history is taken to make sure that person has no history of diseases nor engages in risky social behavior (examples are intravenous drug use or sexual activity with multiple partners). The  donor's travel history is screened to minimize risk of transmitting infections, such as malaria. The donated blood is tested for signs of infectious diseases, such as HIV and hepatitis. The blood is then tested to be sure it is compatible with you in order to minimize the chance of a transfusion reaction. If you or a relative donates blood, this is often done in anticipation of surgery and is not appropriate for emergency situations. It takes many days to process the donated blood. RISKS AND COMPLICATIONS Although transfusion therapy is very safe and saves many lives, the main dangers of transfusion include:   Getting an infectious disease.  Developing a transfusion reaction. This is an allergic reaction to something in the blood you were given. Every precaution is taken to prevent this. The decision to have a blood transfusion has been considered carefully by your caregiver before blood is given. Blood is not given unless the benefits outweigh the risks. AFTER THE TRANSFUSION  Right after receiving a blood transfusion, you will usually feel much better and more energetic. This is especially true if your red blood cells have gotten low (anemic). The transfusion raises the level of the red blood cells which carry oxygen, and this usually causes an energy increase.  The nurse administering the transfusion will monitor you carefully for complications. HOME CARE INSTRUCTIONS  No special instructions are needed after a transfusion. You may find your energy is better. Speak with your caregiver about any limitations on activity for underlying diseases  you may have. SEEK MEDICAL CARE IF:   Your condition is not improving after your transfusion.  You develop redness or irritation at the intravenous (IV) site. SEEK IMMEDIATE MEDICAL CARE IF:  Any of the following symptoms occur over the next 12 hours:  Shaking chills.  You have a temperature by mouth above 102 F (38.9 C), not controlled by  medicine.  Chest, back, or muscle pain.  People around you feel you are not acting correctly or are confused.  Shortness of breath or difficulty breathing.  Dizziness and fainting.  You get a rash or develop hives.  You have a decrease in urine output.  Your urine turns a dark color or changes to pink, red, or brown. Any of the following symptoms occur over the next 10 days:  You have a temperature by mouth above 102 F (38.9 C), not controlled by medicine.  Shortness of breath.  Weakness after normal activity.  The white part of the eye turns yellow (jaundice).  You have a decrease in the amount of urine or are urinating less often.  Your urine turns a dark color or changes to pink, red, or brown. Document Released: 07/03/2000 Document Revised: 09/28/2011 Document Reviewed: 02/20/2008 [facility] Patient Information 2014 [place], [place].  _______________________________________________________________________

## 2017-08-24 NOTE — Progress Notes (Signed)
07-06-17 Surgical Clearance from Dr. Magdalene PatriciaStugart on chart  12-25-16 (Epic) Cardiac Clearance from Dr. Weber CooksKok  10-06-16 [facility](Epic) EKG

## 2017-08-25 ENCOUNTER — Encounter (HOSPITAL_COMMUNITY): Payer: Self-pay

## 2017-08-25 ENCOUNTER — Encounter (HOSPITAL_COMMUNITY)
Admission: RE | Admit: 2017-08-25 | Discharge: 2017-08-25 | Disposition: A | Discharge status: Home / Self Care | Payer: Worker's Compensation | Source: Ambulatory Visit | Attending: Orthopedic Surgery | Admitting: Orthopedic Surgery

## 2017-08-25 ENCOUNTER — Other Ambulatory Visit: Payer: Self-pay

## 2017-08-25 DIAGNOSIS — M1712 Unilateral primary osteoarthritis, left knee: Secondary | ICD-10-CM | POA: Diagnosis not present

## 2017-08-25 DIAGNOSIS — Z01818 Encounter for other preprocedural examination: Secondary | ICD-10-CM | POA: Insufficient documentation

## 2017-08-25 LAB — SURGICAL PCR SCREEN
MRSA, PCR: NEGATIVE
STAPHYLOCOCCUS AUREUS: NEGATIVE

## 2017-08-25 LAB — CBC
HEMATOCRIT: 46.1 % (ref 39.0–52.0)
Hemoglobin: 15.4 g/dL (ref 13.0–17.0)
MCH: 30.2 pg (ref 26.0–34.0)
MCHC: 33.4 g/dL (ref 30.0–36.0)
MCV: 90.4 fL (ref 78.0–100.0)
Platelets: 214 10*3/uL (ref 150–400)
RBC: 5.1 MIL/uL (ref 4.22–5.81)
RDW: 13.7 % (ref 11.5–15.5)
WBC: 3.6 10*3/uL — AB (ref 4.0–10.5)

## 2017-08-25 LAB — BASIC METABOLIC PANEL
ANION GAP: 9 (ref 5–15)
BUN: 12 mg/dL (ref 6–20)
CALCIUM: 9.1 mg/dL (ref 8.9–10.3)
CO2: 26 mmol/L (ref 22–32)
Chloride: 104 mmol/L (ref 101–111)
Creatinine, Ser: 0.88 mg/dL (ref 0.61–1.24)
GFR calc Af Amer: >60 mL/min (ref >60)
GFR calc non Af Amer: >60 mL/min (ref >60)
Glucose, Bld: 150 mg/dL — ABNORMAL HIGH (ref 65–99)
POTASSIUM: 3.2 mmol/L — AB (ref 3.5–5.1)
Sodium: 139 mmol/L (ref 135–145)

## 2017-08-25 LAB — ABO/RH: ABO/RH(D): A POS

## 2017-08-27 MED ORDER — PROPOFOL 10 MG/ML IV BOLUS
INTRAVENOUS | Status: AC
  Filled 2017-08-27: qty 20

## 2017-08-27 MED ORDER — ONDANSETRON HCL 4 MG/2ML IJ SOLN
INTRAMUSCULAR | Status: AC
  Filled 2017-08-27: qty 2

## 2017-08-27 MED ORDER — LIDOCAINE 2% (20 MG/ML) 5 ML SYRINGE
INTRAMUSCULAR | Status: AC
  Filled 2017-08-27: qty 5

## 2017-08-27 MED ORDER — MIDAZOLAM HCL 2 MG/2ML IJ SOLN
INTRAMUSCULAR | Status: AC
  Filled 2017-08-27: qty 2

## 2017-08-27 MED ORDER — FENTANYL CITRATE (PF) 100 MCG/2ML IJ SOLN
INTRAMUSCULAR | Status: AC
  Filled 2017-08-27: qty 2

## 2017-08-29 MED ORDER — DEXTROSE 5 % IV SOLN
3.0000 g | INTRAVENOUS | Status: AC
  Administered 2017-08-30: 3 g via INTRAVENOUS
  Filled 2017-08-29: qty 3

## 2017-08-29 MED ORDER — SODIUM CHLORIDE 0.9 % IV SOLN
1000.0000 mg | INTRAVENOUS | Status: AC
  Administered 2017-08-30: 1000 mg via INTRAVENOUS
  Filled 2017-08-29: qty 1100

## 2017-08-29 NOTE — Anesthesia Preprocedure Evaluation (Addendum)
Anesthesia Evaluation  Patient identified by MRN, date of birth, ID band Patient awake    Reviewed: Allergy & Precautions, NPO status , Patient's Chart, lab work & pertinent test results, reviewed documented beta blocker date and time   Airway Mallampati: I  TM Distance: >3 FB Neck ROM: Full    Dental  (+) Teeth Intact, Dental Advisory Given, Partial Lower   Pulmonary sleep apnea and Continuous Positive Airway Pressure Ventilation ,    breath sounds clear to auscultation       Cardiovascular hypertension, Pt. on medications  Rhythm:Regular Rate:Normal  PAF 2010, Normal CATH 2013   Neuro/Psych    GI/Hepatic negative GI ROS, Neg liver ROS,   Endo/Other  Morbid obesity  Renal/GU negative Renal ROS     Musculoskeletal   Abdominal (+)  Abdomen: soft.    Peds  Hematology   Anesthesia Other Findings   Reproductive/Obstetrics                             Anesthesia Physical  Anesthesia Plan  ASA: III  Anesthesia Plan: Spinal   Post-op Pain Management:  Regional for Post-op pain   Induction:   PONV Risk Score and Plan: 1 and Treatment may vary due to age or medical condition and Propofol infusion  Airway Management Planned: Natural Airway and Nasal Cannula  Additional Equipment: None  Intra-op Plan:   Post-operative Plan:   Informed Consent: I have reviewed the patients History and Physical, chart, labs and discussed the procedure including the risks, benefits and alternatives for the proposed anesthesia with the patient or authorized representative who has indicated his/her understanding and acceptance.     Plan Discussed with: CRNA  Anesthesia Plan Comments:         Anesthesia Quick Evaluation

## 2017-08-30 ENCOUNTER — Other Ambulatory Visit: Payer: Self-pay

## 2017-08-30 ENCOUNTER — Inpatient Hospital Stay (HOSPITAL_COMMUNITY): Payer: Worker's Compensation | Admitting: Anesthesiology

## 2017-08-30 ENCOUNTER — Encounter (HOSPITAL_COMMUNITY): Payer: Self-pay | Admitting: *Deleted

## 2017-08-30 ENCOUNTER — Encounter (HOSPITAL_COMMUNITY)
Admission: RE | Disposition: A | Discharge status: Home / Self Care | Payer: Self-pay | Source: Ambulatory Visit | Attending: Orthopedic Surgery

## 2017-08-30 ENCOUNTER — Observation Stay (HOSPITAL_COMMUNITY)
Admission: RE | Admit: 2017-08-30 | Discharge: 2017-08-31 | Disposition: A | Discharge status: Home / Self Care | Payer: Worker's Compensation | Source: Ambulatory Visit | Attending: Orthopedic Surgery | Admitting: Orthopedic Surgery

## 2017-08-30 DIAGNOSIS — Z7982 Long term (current) use of aspirin: Secondary | ICD-10-CM | POA: Insufficient documentation

## 2017-08-30 DIAGNOSIS — Z6841 Body Mass Index (BMI) 40.0 and over, adult: Secondary | ICD-10-CM | POA: Insufficient documentation

## 2017-08-30 DIAGNOSIS — Z96659 Presence of unspecified artificial knee joint: Secondary | ICD-10-CM

## 2017-08-30 DIAGNOSIS — Z79899 Other long term (current) drug therapy: Secondary | ICD-10-CM | POA: Insufficient documentation

## 2017-08-30 DIAGNOSIS — Z96652 Presence of left artificial knee joint: Secondary | ICD-10-CM

## 2017-08-30 DIAGNOSIS — E7800 Pure hypercholesterolemia, unspecified: Secondary | ICD-10-CM | POA: Diagnosis not present

## 2017-08-30 DIAGNOSIS — G4730 Sleep apnea, unspecified: Secondary | ICD-10-CM | POA: Diagnosis not present

## 2017-08-30 DIAGNOSIS — M1712 Unilateral primary osteoarthritis, left knee: Principal | ICD-10-CM | POA: Insufficient documentation

## 2017-08-30 DIAGNOSIS — I10 Essential (primary) hypertension: Secondary | ICD-10-CM | POA: Insufficient documentation

## 2017-08-30 HISTORY — PX: TOTAL KNEE ARTHROPLASTY: SHX125

## 2017-08-30 LAB — TYPE AND SCREEN
ABO/RH(D): A POS
Antibody Screen: NEGATIVE

## 2017-08-30 SURGERY — ARTHROPLASTY, KNEE, TOTAL
Anesthesia: Spinal | Site: Knee | Laterality: Left

## 2017-08-30 MED ORDER — OXYCODONE HCL 5 MG/5ML PO SOLN
5.0000 mg | Freq: Once | ORAL | Status: DC | PRN
  Filled 2017-08-30: qty 5

## 2017-08-30 MED ORDER — CHLORHEXIDINE GLUCONATE 4 % EX LIQD: 60.0000 mL | Freq: Once | CUTANEOUS | Status: DC

## 2017-08-30 MED ORDER — PHENOL 1.4 % MT LIQD
1.0000 | OROMUCOSAL | Status: DC | PRN
  Filled 2017-08-30: qty 177

## 2017-08-30 MED ORDER — POLYETHYLENE GLYCOL 3350 17 G PO PACK: 17.0000 g | PACK | Freq: Two times a day (BID) | ORAL | 0 refills | Status: DC

## 2017-08-30 MED ORDER — FENTANYL CITRATE (PF) 100 MCG/2ML IJ SOLN: 25.0000 ug | INTRAMUSCULAR | Status: DC | PRN

## 2017-08-30 MED ORDER — SUGAMMADEX SODIUM 500 MG/5ML IV SOLN
INTRAVENOUS | Status: AC
  Filled 2017-08-30: qty 5

## 2017-08-30 MED ORDER — MIDAZOLAM HCL 2 MG/2ML IJ SOLN
INTRAMUSCULAR | Status: AC
  Filled 2017-08-30: qty 2

## 2017-08-30 MED ORDER — DOCUSATE SODIUM 100 MG PO CAPS: 100.0000 mg | ORAL_CAPSULE | Freq: Two times a day (BID) | ORAL | 0 refills | Status: DC

## 2017-08-30 MED ORDER — MIDAZOLAM HCL 5 MG/5ML IJ SOLN
INTRAMUSCULAR | Status: DC | PRN
  Administered 2017-08-30: 2 mg via INTRAVENOUS

## 2017-08-30 MED ORDER — MENTHOL 3 MG MT LOZG: 1.0000 | LOZENGE | OROMUCOSAL | Status: DC | PRN

## 2017-08-30 MED ORDER — DIPHENHYDRAMINE HCL 12.5 MG/5ML PO ELIX: 12.5000 mg | ORAL_SOLUTION | ORAL | Status: DC | PRN

## 2017-08-30 MED ORDER — HYDROCHLOROTHIAZIDE 12.5 MG PO CAPS
25.0000 mg | ORAL_CAPSULE | Freq: Every day | ORAL | Status: DC
  Filled 2017-08-30: qty 2

## 2017-08-30 MED ORDER — BUPIVACAINE-EPINEPHRINE (PF) 0.25% -1:200000 IJ SOLN
INTRAMUSCULAR | Status: DC | PRN
  Administered 2017-08-30: 30 mL

## 2017-08-30 MED ORDER — FERROUS SULFATE 325 (65 FE) MG PO TABS: 325.0000 mg | ORAL_TABLET | Freq: Three times a day (TID) | ORAL | Status: DC

## 2017-08-30 MED ORDER — ASPIRIN 81 MG PO CHEW
81.0000 mg | CHEWABLE_TABLET | Freq: Two times a day (BID) | ORAL | Status: DC
  Administered 2017-08-30 – 2017-08-31 (×2): 81 mg via ORAL
  Filled 2017-08-30 (×2): qty 1

## 2017-08-30 MED ORDER — BUPIVACAINE-EPINEPHRINE (PF) 0.5% -1:200000 IJ SOLN
INTRAMUSCULAR | Status: DC | PRN
  Administered 2017-08-30: 30 mL via PERINEURAL

## 2017-08-30 MED ORDER — DOCUSATE SODIUM 100 MG PO CAPS
100.0000 mg | ORAL_CAPSULE | Freq: Two times a day (BID) | ORAL | Status: DC
  Administered 2017-08-31: 09:00:00 100 mg via ORAL
  Filled 2017-08-30: qty 1

## 2017-08-30 MED ORDER — HYDROCODONE-ACETAMINOPHEN 7.5-325 MG PO TABS
1.0000 | ORAL_TABLET | ORAL | Status: DC | PRN
  Administered 2017-08-30: 1 via ORAL
  Filled 2017-08-30: qty 1

## 2017-08-30 MED ORDER — METOCLOPRAMIDE HCL 5 MG/ML IJ SOLN
5.0000 mg | Freq: Three times a day (TID) | INTRAMUSCULAR | Status: DC | PRN
  Administered 2017-08-30 (×2): 10 mg via INTRAVENOUS
  Filled 2017-08-30 (×2): qty 2

## 2017-08-30 MED ORDER — ALUM & MAG HYDROXIDE-SIMETH 200-200-20 MG/5ML PO SUSP: 15.0000 mL | ORAL | Status: DC | PRN

## 2017-08-30 MED ORDER — ACETAMINOPHEN 650 MG RE SUPP: 650.0000 mg | RECTAL | Status: DC | PRN

## 2017-08-30 MED ORDER — CELECOXIB 200 MG PO CAPS
200.0000 mg | ORAL_CAPSULE | Freq: Two times a day (BID) | ORAL | Status: DC
  Administered 2017-08-31: 09:00:00 200 mg via ORAL
  Filled 2017-08-30: qty 1

## 2017-08-30 MED ORDER — KETOROLAC TROMETHAMINE 30 MG/ML IJ SOLN
INTRAMUSCULAR | Status: DC | PRN
  Administered 2017-08-30: 30 mg

## 2017-08-30 MED ORDER — SODIUM CHLORIDE 0.9 % IJ SOLN
INTRAMUSCULAR | Status: DC | PRN
  Administered 2017-08-30: 30 mL

## 2017-08-30 MED ORDER — SODIUM CHLORIDE 0.9 % IR SOLN
Status: DC | PRN
  Administered 2017-08-30: 1000 mL

## 2017-08-30 MED ORDER — ONDANSETRON HCL 4 MG/2ML IJ SOLN
4.0000 mg | Freq: Three times a day (TID) | INTRAMUSCULAR | Status: DC | PRN
  Administered 2017-08-30 – 2017-08-31 (×2): 4 mg via INTRAVENOUS
  Filled 2017-08-30 (×2): qty 2

## 2017-08-30 MED ORDER — DEXAMETHASONE SODIUM PHOSPHATE 10 MG/ML IJ SOLN
INTRAMUSCULAR | Status: AC
  Filled 2017-08-30: qty 1

## 2017-08-30 MED ORDER — BUPIVACAINE-EPINEPHRINE 0.25% -1:200000 IJ SOLN
INTRAMUSCULAR | Status: AC
  Filled 2017-08-30: qty 1

## 2017-08-30 MED ORDER — POLYETHYLENE GLYCOL 3350 17 G PO PACK
17.0000 g | PACK | Freq: Two times a day (BID) | ORAL | Status: DC
  Administered 2017-08-31: 09:00:00 17 g via ORAL
  Filled 2017-08-30: qty 1

## 2017-08-30 MED ORDER — HYDROMORPHONE HCL 1 MG/ML IJ SOLN
INTRAMUSCULAR | Status: DC | PRN
  Administered 2017-08-30 (×2): 1 mg via INTRAVENOUS

## 2017-08-30 MED ORDER — SUCCINYLCHOLINE CHLORIDE 200 MG/10ML IV SOSY
PREFILLED_SYRINGE | INTRAVENOUS | Status: DC | PRN
  Administered 2017-08-30: 140 mg via INTRAVENOUS

## 2017-08-30 MED ORDER — HYDROCODONE-ACETAMINOPHEN 7.5-325 MG PO TABS
2.0000 | ORAL_TABLET | ORAL | Status: DC | PRN
  Administered 2017-08-31 (×3): 2 via ORAL
  Filled 2017-08-30 (×3): qty 2

## 2017-08-30 MED ORDER — POTASSIUM CHLORIDE ER 10 MEQ PO TBCR
10.0000 meq | EXTENDED_RELEASE_TABLET | Freq: Every day | ORAL | Status: DC
  Administered 2017-08-30 – 2017-08-31 (×2): 10 meq via ORAL
  Filled 2017-08-30 (×4): qty 1

## 2017-08-30 MED ORDER — PROMETHAZINE HCL 25 MG/ML IJ SOLN: 6.2500 mg | INTRAMUSCULAR | Status: DC | PRN

## 2017-08-30 MED ORDER — HYDROMORPHONE HCL 2 MG/ML IJ SOLN
INTRAMUSCULAR | Status: AC
  Filled 2017-08-30: qty 1

## 2017-08-30 MED ORDER — DEXAMETHASONE SODIUM PHOSPHATE 10 MG/ML IJ SOLN
10.0000 mg | Freq: Once | INTRAMUSCULAR | Status: AC
  Administered 2017-08-30: 10 mg via INTRAVENOUS

## 2017-08-30 MED ORDER — ASPIRIN 81 MG PO CHEW: 81.0000 mg | CHEWABLE_TABLET | Freq: Two times a day (BID) | ORAL | 0 refills | Status: AC

## 2017-08-30 MED ORDER — METOPROLOL SUCCINATE ER 50 MG PO TB24
100.0000 mg | ORAL_TABLET | Freq: Every day | ORAL | Status: DC
  Administered 2017-08-31: 100 mg via ORAL
  Filled 2017-08-30: qty 2

## 2017-08-30 MED ORDER — METHOCARBAMOL 500 MG PO TABS: 500.0000 mg | ORAL_TABLET | Freq: Four times a day (QID) | ORAL | 0 refills | Status: DC | PRN

## 2017-08-30 MED ORDER — SODIUM CHLORIDE 0.9 % IV SOLN
INTRAVENOUS | Status: DC
  Administered 2017-08-30 – 2017-08-31 (×2): via INTRAVENOUS

## 2017-08-30 MED ORDER — FENTANYL CITRATE (PF) 100 MCG/2ML IJ SOLN
INTRAMUSCULAR | Status: DC | PRN
  Administered 2017-08-30: 100 ug via INTRAVENOUS
  Administered 2017-08-30 (×2): 50 ug via INTRAVENOUS

## 2017-08-30 MED ORDER — ATORVASTATIN CALCIUM 10 MG PO TABS
10.0000 mg | ORAL_TABLET | Freq: Every day | ORAL | Status: DC
  Administered 2017-08-30 – 2017-08-31 (×2): 10 mg via ORAL
  Filled 2017-08-30 (×2): qty 1

## 2017-08-30 MED ORDER — PROPOFOL 10 MG/ML IV BOLUS
INTRAVENOUS | Status: AC
  Filled 2017-08-30: qty 60

## 2017-08-30 MED ORDER — METHOCARBAMOL 500 MG PO TABS: 500.0000 mg | ORAL_TABLET | Freq: Four times a day (QID) | ORAL | Status: DC | PRN

## 2017-08-30 MED ORDER — HYDROMORPHONE HCL 1 MG/ML IJ SOLN
0.5000 mg | INTRAMUSCULAR | Status: DC | PRN
  Administered 2017-08-30: 1 mg via INTRAVENOUS
  Filled 2017-08-30: qty 1

## 2017-08-30 MED ORDER — PROPOFOL 10 MG/ML IV BOLUS
INTRAVENOUS | Status: AC
  Filled 2017-08-30: qty 20

## 2017-08-30 MED ORDER — SODIUM CHLORIDE 0.9 % IJ SOLN
INTRAMUSCULAR | Status: AC
  Filled 2017-08-30: qty 50

## 2017-08-30 MED ORDER — KETOROLAC TROMETHAMINE 30 MG/ML IJ SOLN
INTRAMUSCULAR | Status: AC
  Filled 2017-08-30: qty 1

## 2017-08-30 MED ORDER — HYDROCODONE-ACETAMINOPHEN 7.5-325 MG PO TABS: 1.0000 | ORAL_TABLET | ORAL | 0 refills | Status: DC | PRN

## 2017-08-30 MED ORDER — ONDANSETRON HCL 4 MG/2ML IJ SOLN
INTRAMUSCULAR | Status: AC
  Filled 2017-08-30: qty 2

## 2017-08-30 MED ORDER — TRANEXAMIC ACID 1000 MG/10ML IV SOLN
1000.0000 mg | Freq: Once | INTRAVENOUS | Status: AC
  Administered 2017-08-30: 1000 mg via INTRAVENOUS
  Filled 2017-08-30: qty 1100

## 2017-08-30 MED ORDER — PROPOFOL 10 MG/ML IV BOLUS
INTRAVENOUS | Status: DC | PRN
  Administered 2017-08-30: 200 mg via INTRAVENOUS
  Administered 2017-08-30: 50 mg via INTRAVENOUS
  Administered 2017-08-30: 100 mg via INTRAVENOUS
  Administered 2017-08-30: 50 mg via INTRAVENOUS
  Administered 2017-08-30: 100 mg via INTRAVENOUS

## 2017-08-30 MED ORDER — CEFAZOLIN SODIUM-DEXTROSE 2-4 GM/100ML-% IV SOLN
2.0000 g | Freq: Four times a day (QID) | INTRAVENOUS | Status: AC
  Administered 2017-08-30 (×2): 2 g via INTRAVENOUS
  Filled 2017-08-30 (×2): qty 100

## 2017-08-30 MED ORDER — FENTANYL CITRATE (PF) 100 MCG/2ML IJ SOLN
INTRAMUSCULAR | Status: AC
  Filled 2017-08-30: qty 2

## 2017-08-30 MED ORDER — ONDANSETRON HCL 4 MG/2ML IJ SOLN
INTRAMUSCULAR | Status: DC | PRN
  Administered 2017-08-30: 4 mg via INTRAVENOUS

## 2017-08-30 MED ORDER — DEXAMETHASONE SODIUM PHOSPHATE 10 MG/ML IJ SOLN
10.0000 mg | Freq: Once | INTRAMUSCULAR | Status: AC
  Administered 2017-08-31: 07:00:00 10 mg via INTRAVENOUS
  Filled 2017-08-30: qty 1

## 2017-08-30 MED ORDER — FERROUS SULFATE 325 (65 FE) MG PO TABS: 325.0000 mg | ORAL_TABLET | Freq: Three times a day (TID) | ORAL | 3 refills | Status: DC

## 2017-08-30 MED ORDER — BISACODYL 10 MG RE SUPP: 10.0000 mg | Freq: Every day | RECTAL | Status: DC | PRN

## 2017-08-30 MED ORDER — ACETAMINOPHEN 325 MG PO TABS
650.0000 mg | ORAL_TABLET | ORAL | Status: DC | PRN
Start: 2017-08-30 — End: 2017-08-31

## 2017-08-30 MED ORDER — METOCLOPRAMIDE HCL 5 MG PO TABS: 5.0000 mg | ORAL_TABLET | Freq: Three times a day (TID) | ORAL | Status: DC | PRN

## 2017-08-30 MED ORDER — METHOCARBAMOL 1000 MG/10ML IJ SOLN
500.0000 mg | Freq: Four times a day (QID) | INTRAVENOUS | Status: DC | PRN
  Filled 2017-08-30: qty 5

## 2017-08-30 MED ORDER — ONDANSETRON HCL 4 MG PO TABS: 4.0000 mg | ORAL_TABLET | Freq: Three times a day (TID) | ORAL | Status: DC | PRN

## 2017-08-30 MED ORDER — LACTATED RINGERS IV SOLN
INTRAVENOUS | Status: DC | PRN
  Administered 2017-08-30 (×2): via INTRAVENOUS

## 2017-08-30 MED ORDER — SUGAMMADEX SODIUM 500 MG/5ML IV SOLN
INTRAVENOUS | Status: DC | PRN
  Administered 2017-08-30: 450 mg via INTRAVENOUS

## 2017-08-30 MED ORDER — OXYCODONE HCL 5 MG PO TABS: 5.0000 mg | ORAL_TABLET | Freq: Once | ORAL | Status: DC | PRN

## 2017-08-30 MED ORDER — MAGNESIUM CITRATE PO SOLN: 1.0000 | Freq: Once | ORAL | Status: DC | PRN

## 2017-08-30 MED ORDER — ROCURONIUM BROMIDE 10 MG/ML (PF) SYRINGE
PREFILLED_SYRINGE | INTRAVENOUS | Status: DC | PRN
  Administered 2017-08-30: 50 mg via INTRAVENOUS

## 2017-08-30 SURGICAL SUPPLY — 46 items
ADH SKN CLS APL DERMABOND .7 (GAUZE/BANDAGES/DRESSINGS) ×1
BAG DECANTER FOR FLEXI CONT (MISCELLANEOUS) IMPLANT
BAG SPEC THK2 15X12 ZIP CLS (MISCELLANEOUS)
BAG ZIPLOCK 12X15 (MISCELLANEOUS) IMPLANT
BANDAGE ACE 6X5 VEL STRL LF (GAUZE/BANDAGES/DRESSINGS) ×5 IMPLANT
BLADE SAW SGTL 11.0X1.19X90.0M (BLADE) IMPLANT
BLADE SAW SGTL 13.0X1.19X90.0M (BLADE) ×3 IMPLANT
BOWL SMART MIX CTS (DISPOSABLE) ×3 IMPLANT
CAPT KNEE TOTAL 3 ATTUNE ×2 IMPLANT
CEMENT HV SMART SET (Cement) ×4 IMPLANT
COVER SURGICAL LIGHT HANDLE (MISCELLANEOUS) ×3 IMPLANT
CUFF TOURN SGL QUICK 34 (TOURNIQUET CUFF) ×3
CUFF TRNQT CYL 34X4X40X1 (TOURNIQUET CUFF) ×1 IMPLANT
DECANTER SPIKE VIAL GLASS SM (MISCELLANEOUS) ×3 IMPLANT
DERMABOND ADVANCED (GAUZE/BANDAGES/DRESSINGS) ×2
DERMABOND ADVANCED .7 DNX12 (GAUZE/BANDAGES/DRESSINGS) ×1 IMPLANT
DRAPE U-SHAPE 47X51 STRL (DRAPES) ×3 IMPLANT
DRESSING AQUACEL AG SP 3.5X10 (GAUZE/BANDAGES/DRESSINGS) ×1 IMPLANT
DRSG AQUACEL AG SP 3.5X10 (GAUZE/BANDAGES/DRESSINGS) ×3
DURAPREP 26ML APPLICATOR (WOUND CARE) ×6 IMPLANT
ELECT REM PT RETURN 15FT ADLT (MISCELLANEOUS) ×3 IMPLANT
GLOVE BIOGEL M 7.0 STRL (GLOVE) IMPLANT
GLOVE BIOGEL PI IND STRL 7.5 (GLOVE) ×1 IMPLANT
GLOVE BIOGEL PI IND STRL 8.5 (GLOVE) ×1 IMPLANT
GLOVE BIOGEL PI INDICATOR 7.5 (GLOVE) ×2
GLOVE BIOGEL PI INDICATOR 8.5 (GLOVE) ×2
GLOVE ECLIPSE 8.0 STRL XLNG CF (GLOVE) ×3 IMPLANT
GLOVE ORTHO TXT STRL SZ7.5 (GLOVE) ×6 IMPLANT
GOWN STRL REUS W/TWL LRG LVL3 (GOWN DISPOSABLE) ×3 IMPLANT
GOWN STRL REUS W/TWL XL LVL3 (GOWN DISPOSABLE) ×3 IMPLANT
HANDPIECE INTERPULSE COAX TIP (DISPOSABLE) ×3
MANIFOLD NEPTUNE II (INSTRUMENTS) ×3 IMPLANT
PACK TOTAL KNEE CUSTOM (KITS) ×3 IMPLANT
POSITIONER SURGICAL ARM (MISCELLANEOUS) ×3 IMPLANT
SET HNDPC FAN SPRY TIP SCT (DISPOSABLE) ×1 IMPLANT
SET PAD KNEE POSITIONER (MISCELLANEOUS) ×3 IMPLANT
SUT MNCRL AB 4-0 PS2 18 (SUTURE) ×3 IMPLANT
SUT STRATAFIX PDS+ 0 24IN (SUTURE) ×2 IMPLANT
SUT VIC AB 1 CT1 36 (SUTURE) ×5 IMPLANT
SUT VIC AB 2-0 CT1 27 (SUTURE) ×9
SUT VIC AB 2-0 CT1 TAPERPNT 27 (SUTURE) ×3 IMPLANT
SYR 50ML LL SCALE MARK (SYRINGE) ×3 IMPLANT
TRAY FOLEY W/METER SILVER 16FR (SET/KITS/TRAYS/PACK) ×3 IMPLANT
WATER STERILE IRR 1000ML POUR (IV SOLUTION) ×3 IMPLANT
WRAP KNEE MAXI GEL POST OP (GAUZE/BANDAGES/DRESSINGS) ×3 IMPLANT
YANKAUER SUCT BULB TIP 10FT TU (MISCELLANEOUS) ×3 IMPLANT

## 2017-08-30 NOTE — Anesthesia Procedure Notes (Signed)
Anesthesia Regional Block: Adductor canal block   Pre-Anesthetic Checklist: ,, timeout performed, Correct Patient, Correct Site, Correct Laterality, Correct Procedure, Correct Position, site marked, Risks and benefits discussed,  Surgical consent,  Pre-op evaluation,  At surgeon's request and post-op pain management  Laterality: Left  Prep: chloraprep       Needles:  Injection technique: Single-shot  Needle Type: Echogenic Needle     Needle Length: 10cm  Needle Gauge: 21     Additional Needles:   Narrative:  Start time: 08/30/2017 6:55 AM End time: 08/30/2017 7:00 AM Injection made incrementally with aspirations every 5 mL.  Performed by: Personally  Anesthesiologist: Beryle LatheBrock, Tamberly Pomplun E, MD  Additional Notes: No pain on injection. No increased resistance to injection. Injection made in 5cc increments. Good needle visualization. Patient tolerated the procedure well.

## 2017-08-30 NOTE — Progress Notes (Signed)
PT Cancellation Note  Patient Details Name: Colton Powell MRN: [id number] DOB: [birth year]   Cancelled Treatment:    Reason Eval/Treat Not Completed: Patient not medically ready,    Rada HayHill, Dennette Faulconer Elizabeth 08/30/2017, 7:21 PM

## 2017-08-30 NOTE — Op Note (Signed)
NAME:  Colton Powell                      MEDICAL RECORD NO.:  [id number]                             FACILITY:  [facility]      PHYSICIAN:  Madlyn Frankel. Charlann Boxer, M.D.  DATE OF BIRTH:  07/31/1960      DATE OF PROCEDURE:  08/30/2017                                     OPERATIVE REPORT         PREOPERATIVE DIAGNOSIS:  Left knee osteoarthritis.      POSTOPERATIVE DIAGNOSIS:  Left knee osteoarthritis.      FINDINGS:  The patient was noted to have complete loss of cartilage and   bone-on-bone arthritis with associated osteophytes in the lateral and patellofemoral compartments of   the knee with a significant synovitis and associated effusion.      PROCEDURE:  Left total knee replacement.      COMPONENTS USED:  DePuy Attune rotating platform posterior stabilized knee   system, a size 6 femur, 7 tibia, size 6 mm PS AOX insert, and 41 anatomic patellar   button.      SURGEON:  Madlyn Frankel. Charlann Boxer, M.D.      ASSISTANT:  Lanney Gins, PA-C.      ANESTHESIA:  General and Regional.      SPECIMENS:  None.      COMPLICATION:  None.      DRAINS:  None.  EBL: <150cc      TOURNIQUET TIME:   Total Tourniquet Time Documented: Thigh (Left) - 45 minutes Total: Thigh (Left) - 45 minutes  .      The patient was stable to the recovery room.      INDICATION FOR PROCEDURE:  Colton Powell is a 57 y.o. male patient of   mine.  The patient had been seen, evaluated, and treated conservatively in the   office with medication, activity modification, and injections.  The patient had   radiographic changes of bone-on-bone arthritis with endplate sclerosis and osteophytes noted.      The patient failed conservative measures including medication, injections, and activity modification, and at this point was ready for more definitive measures.   Based on the radiographic changes and failed conservative measures, the patient   decided to proceed with total knee replacement.  Risks of infection,   DVT, component  failure, need for revision surgery, postop course, and   expectations were all   discussed and reviewed.  Consent was obtained for benefit of pain   relief.      PROCEDURE IN DETAIL:  The patient was brought to the operative theater.   Once adequate anesthesia, preoperative antibiotics, 3 gm of Ancef, 1 gm of Tranexamic Acid, and 10 mg of Decadron administered, the patient was positioned supine with the left thigh tourniquet placed.  The  left lower extremity was prepped and draped in sterile fashion.  A time-   out was performed identifying the patient, planned procedure, and   extremity.      The left lower extremity was placed in the [facility] leg holder.  The leg was   exsanguinated, tourniquet elevated to 250 mmHg.  A midline incision was   made  followed by median parapatellar arthrotomy.  Following initial   exposure, attention was first directed to the patella.  The patella was Precut   measurement was noted to be 25 mm.  I resected down to 14 mm and used a   41 patellar button to restore patellar height as well as cover the cut   surface.      The lug holes were drilled and a metal shim was placed to protect the   patella from retractors and saw blades.      At this point, attention was now directed to the femur.  The femoral   canal was opened with a drill, irrigated to try to prevent fat emboli.  An   intramedullary rod was passed at 5 degrees valgus, 11 mm of bone was   resected off the distal femur due to flexion contracture.  Following this resection, the tibia was   subluxated anteriorly.  Using the extramedullary guide, 2 mm of bone was resected off   the proximal lateral tibia.  We confirmed the gap would be   stable medially and laterally with a sixe 5 spacer block as well as confirmed   the cut was perpendicular in the coronal plane, checking with an alignment rod.      Once this was done, I sized the femur to be a size 6 in the anterior-   posterior dimension, chose a  standard component based on medial and   lateral dimension.  The size 6 rotation block was then pinned in   position anterior referenced using the C-clamp to set rotation.  The   anterior, posterior, and  chamfer cuts were made without difficulty nor   notching making certain that I was along the anterior cortex to help   with flexion gap stability.      The final box cut was made off the lateral aspect of distal femur.      At this point, the tibia was sized to be a size 7, the size 7 tray was   then pinned in position through the medial third of the tubercle,   drilled, and keel punched.  Trial reduction was now carried with a 6 femur,  7 tibia, a size 5 then 6 mm PS insert, and the 41 anatomic patella botton.  The knee was brought to   extension, full extension with good flexion stability with the patella   tracking through the trochlea without application of pressure.  Given   all these findings the femoral lug holes were drilled and then the trial components removed.  Final components were   opened and cement was mixed.  The knee was irrigated with normal saline   solution and pulse lavage.  The synovial lining was   then injected with 30 cc of 0.25% Marcaine with epinephrine and 1 cc of Toradol plus 30 cc of NS for a total of 61 cc.      The knee was irrigated.  Final implants were then cemented onto clean and   dried cut surfaces of bone with the knee brought to extension with a size 6 mm PS trial insert.      Once the cement had fully cured, the excess cement was removed   throughout the knee.  I confirmed I was satisfied with the range of   motion and stability, and the final size 6 mm PS AOX insert was chosen.  It was   placed into the knee.  The tourniquet had been let down at 46 minutes.  No significant   hemostasis required.  The   extensor mechanism was then reapproximated using #1 Vicryl and #1 Stratafix sutures with the knee   in flexion.  The   remaining wound was  closed with 2-0 Vicryl and running 4-0 Monocryl.   The knee was cleaned, dried, dressed sterilely using Dermabond and   Aquacel dressing.  The patient was then   brought to recovery room in stable condition, tolerating the procedure   well.   Please note that Physician Assistant, Lanney Gins, PA-C, was present for the entirety of the case, and was utilized for pre-operative positioning, peri-operative retractor management, general facilitation of the procedure.  He was also utilized for primary wound closure at the end of the case.              Madlyn Frankel Charlann Boxer, M.D.    08/30/2017 9:33 AM

## 2017-08-30 NOTE — Anesthesia Procedure Notes (Signed)
Procedure Name: Intubation Date/Time: 08/30/2017 8:23 AM Performed by: [place] Rylinn Linzy C, CRNA Pre-anesthesia Checklist: Patient identified, Emergency Drugs available, Suction available and Patient being monitored Patient Re-evaluated:Patient Re-evaluated prior to induction Oxygen Delivery Method: Circle system utilized Preoxygenation: Pre-oxygenation with 100% oxygen Induction Type: IV induction Ventilation: Mask ventilation without difficulty Laryngoscope Size: Mac and 4 Grade View: Grade I Tube type: Oral Tube size: 7.5 mm Number of attempts: 1 Airway Equipment and Method: Stylet and Oral airway Placement Confirmation: ETT inserted through vocal cords under direct vision,  positive ETCO2 and breath sounds checked- equal and bilateral Secured at: 22 cm Tube secured with: Tape Dental Injury: Teeth and Oropharynx as per pre-operative assessment

## 2017-08-30 NOTE — Anesthesia Procedure Notes (Addendum)
Procedure Name: LMA Insertion Date/Time: 08/30/2017 7:56 AM Performed by: [place], Andrey Hoobler C, CRNA Pre-anesthesia Checklist: Patient identified, Emergency Drugs available, Suction available and Patient being monitored Patient Re-evaluated:Patient Re-evaluated prior to induction Oxygen Delivery Method: Circle system utilized Preoxygenation: Pre-oxygenation with 100% oxygen Induction Type: IV induction Ventilation: Mask ventilation without difficulty LMA: LMA inserted LMA Size: 4.0 Tube size: 5.0 mm Number of attempts: 1 Airway Equipment and Method: Bite block Placement Confirmation: positive ETCO2 Tube secured with: Tape Dental Injury: Teeth and Oropharynx as per pre-operative assessment

## 2017-08-30 NOTE — Anesthesia Postprocedure Evaluation (Signed)
Anesthesia Post Note  Patient: Colton Powell  Procedure(s) Performed: LEFT TOTAL KNEE ARTHROPLASTY (Left Knee)     Patient location during evaluation: PACU Anesthesia Type: General Level of consciousness: awake and alert Pain management: pain level controlled Vital Signs Assessment: post-procedure vital signs reviewed and stable Respiratory status: spontaneous breathing, nonlabored ventilation, respiratory function stable and patient connected to nasal cannula oxygen Cardiovascular status: blood pressure returned to baseline and stable Postop Assessment: no apparent nausea or vomiting Anesthetic complications: no    Last Vitals:  Vitals:   08/30/17 0548 08/30/17 1005  BP: (!) 137/92 (!) 149/90  Pulse: 66 72  Resp: 18 17  Temp: 36.8 C 36.6 C  SpO2: 96% 96%    Last Pain:  Vitals:   08/30/17 1005  TempSrc:   PainSc: Asleep                 Beryle Lathehomas E Brock

## 2017-08-30 NOTE — Transfer of Care (Signed)
Immediate Anesthesia Transfer of Care Note  Patient: Colton Powell  Procedure(s) Performed: LEFT TOTAL KNEE ARTHROPLASTY (Left Knee)  Patient Location: PACU  Anesthesia Type:General  Level of Consciousness: awake and alert   Airway & Oxygen Therapy: Patient Spontanous Breathing and Patient connected to face mask oxygen  Post-op Assessment: Report given to RN and Post -op Vital signs reviewed and stable  Post vital signs: Reviewed and stable  Last Vitals:  Vitals:   08/30/17 0548  BP: (!) 137/92  Pulse: 66  Resp: 18  Temp: 36.8 C  SpO2: 96%    Last Pain:  Vitals:   08/30/17 0548  TempSrc: Oral         Complications: No apparent anesthesia complications

## 2017-08-30 NOTE — Anesthesia Procedure Notes (Signed)
Spinal  Patient location during procedure: OR Start time: 08/30/2017 7:36 AM End time: 08/30/2017 7:50 AM Staffing Anesthesiologist: Beryle LatheBrock, Thomas E, MD Performed: anesthesiologist  Preanesthetic Checklist Completed: patient identified, surgical consent, pre-op evaluation, timeout performed, IV checked, risks and benefits discussed and monitors and equipment checked Spinal Block Patient position: sitting Prep: DuraPrep Patient monitoring: heart rate, cardiac monitor, continuous pulse ox and blood pressure Approach: midline Location: L3-4 Injection technique: single-shot Needle Needle type: Pencan  Needle gauge: 24 G Additional Notes Functioning IV was confirmed and monitors were applied. Sterile prep and drape, including hand hygiene, mask, and sterile gloves were used. The patient was positioned and the spine was prepped. The skin was anesthetized with lidocaine. First 2 attempts by CRNA, unsuccessful. 2 attempts by MD (including 1 attempt right paramedian) without success. Opted to convert to general anesthesia for surgical procedure. The patient tolerated the procedure well. Consent was obtained prior to the procedure with all questions answered and concerns addressed. Risks including, but not limited to, bleeding, infection, nerve damage, paralysis, failed block, inadequate analgesia, allergic reaction, high spinal, itching, and headache were discussed and the patient wished to proceed.  Leslye Peerhomas Brock, MD

## 2017-08-30 NOTE — Plan of Care (Signed)
Plan of care discussd

## 2017-08-30 NOTE — Interval H&P Note (Signed)
History and Physical Interval Note:  08/30/2017 7:28 AM  Colton Powell  has presented today for surgery, with the diagnosis of Left knee osteoarthritis  The various methods of treatment have been discussed with the patient and family. After consideration of risks, benefits and other options for treatment, the patient has consented to  Procedure(s) with comments: LEFT TOTAL KNEE ARTHROPLASTY (Left) - 90 mins as a surgical intervention .  The patient's history has been reviewed, patient examined, no change in status, stable for surgery.  I have reviewed the patient's chart and labs.  Questions were answered to the patient's satisfaction.     Shelda PalMatthew D Illona Bulman

## 2017-08-30 NOTE — Discharge Instructions (Signed)

## 2017-08-31 DIAGNOSIS — M1712 Unilateral primary osteoarthritis, left knee: Secondary | ICD-10-CM | POA: Diagnosis not present

## 2017-08-31 LAB — CBC
HEMATOCRIT: 42.2 % (ref 39.0–52.0)
Hemoglobin: 13.5 g/dL (ref 13.0–17.0)
MCH: 29.5 pg (ref 26.0–34.0)
MCHC: 32 g/dL (ref 30.0–36.0)
MCV: 92.3 fL (ref 78.0–100.0)
PLATELETS: 184 10*3/uL (ref 150–400)
RBC: 4.57 MIL/uL (ref 4.22–5.81)
RDW: 13.9 % (ref 11.5–15.5)
WBC: 8 10*3/uL (ref 4.0–10.5)

## 2017-08-31 LAB — BASIC METABOLIC PANEL
ANION GAP: 12 (ref 5–15)
BUN: 21 mg/dL — AB (ref 6–20)
CO2: 26 mmol/L (ref 22–32)
Calcium: 8.7 mg/dL — ABNORMAL LOW (ref 8.9–10.3)
Chloride: 103 mmol/L (ref 101–111)
Creatinine, Ser: 1.07 mg/dL (ref 0.61–1.24)
GFR calc Af Amer: >60 mL/min (ref >60)
GFR calc non Af Amer: >60 mL/min (ref >60)
GLUCOSE: 138 mg/dL — AB (ref 65–99)
POTASSIUM: 3.2 mmol/L — AB (ref 3.5–5.1)
Sodium: 141 mmol/L (ref 135–145)

## 2017-08-31 MED ORDER — ONDANSETRON HCL 4 MG PO TABS: 4.0000 mg | ORAL_TABLET | Freq: Three times a day (TID) | ORAL | 0 refills | Status: DC | PRN

## 2017-08-31 NOTE — Evaluation (Signed)
Occupational Therapy Evaluation Patient Details Name: Colton Powell MRN: [id number] DOB: 07-25-1960 Today's Date: 08/31/2017    History of Present Illness Pt is a 57 y/o M now s/p L TKA   Clinical Impression   This 57 y/o M presents with the above. At baseline pt is mod independent with ADLs and functional mobility, reporting some difficulties with LB dressing. Pt presenting with post-op pain and weakness, completing short in-room functional mobility at RW level this session with MinA. Currently requires ModA for LB ADLs. Pt will benefit from continued acute OT services to review shower transfers as well as to maximize his overall safety and independence with ADLs and mobility prior to return home.     Follow Up Recommendations  DC plan and follow up therapy as arranged by surgeon;Supervision/Assistance - 24 hour    Equipment Recommendations  3 in 1 bedside commode;Other (comment)(will need bariatric 3:1 )           Precautions / Restrictions Precautions Precautions: Knee Precaution Comments: verbally reviewed  Restrictions Weight Bearing Restrictions: No      Mobility Bed Mobility Overal bed mobility: Needs Assistance Bed Mobility: Supine to Sit     Supine to sit: Min assist;HOB elevated     General bed mobility comments: assist to advance LLE over EOB, increased time  Transfers Overall transfer level: Needs assistance Equipment used: Rolling walker (2 wheeled) Transfers: Sit to/from [employer] Sit to Stand: Min assist Stand pivot transfers: Min assist       General transfer comment: verbal cues for hand/LE placement, assist to rise and steady                                                ADL either performed or assessed with clinical judgement   ADL Overall ADL's : Needs assistance/impaired Eating/Feeding: Modified independent;Sitting   Grooming: Set up;Sitting   Upper Body Bathing: Min guard;Sitting   Lower Body  Bathing: Moderate assistance;Sit to/from stand   Upper Body Dressing : Min guard;Sitting   Lower Body Dressing: Moderate assistance;Sit to/from stand   Toilet Transfer: Minimal assistance;Stand-pivot;BSC;RW Statistician Details (indicate cue type and reason): simulated in transfer to recliner  Toileting- Clothing Manipulation and Hygiene: Minimal assistance;Sit to/from stand       Functional mobility during ADLs: Minimal assistance;Rolling walker General ADL Comments: Pt completed short in-room functional mobilty from EOB to recliner, completing grooming ADLs once seated in recliner; reports of some light headedness during mobility, BP stable                          Pertinent Vitals/Pain Pain Assessment: 0-10 Pain Score: 5  Pain Location: L knee  Pain Descriptors / Indicators: Sore;Aching Pain Intervention(s): Limited activity within patient's tolerance;Monitored during session;Premedicated before session;Ice applied          Extremity/Trunk Assessment Upper Extremity Assessment Upper Extremity Assessment: Overall WFL for tasks assessed   Lower Extremity Assessment Lower Extremity Assessment: Defer to PT evaluation       Communication Communication Communication: No difficulties   Cognition Arousal/Alertness: Awake/alert Behavior During Therapy: WFL for tasks assessed/performed Overall Cognitive Status: Within Functional Limits for tasks assessed  Home Living Family/patient expects to be discharged to:: Private residence Living Arrangements: Spouse/significant other Available Help at Discharge: Family Type of Home: House Home Access: Stairs to enter Enter[employer] CorporationEntrance Stairs-Number of Steps: threshold    Home Layout: One level     Bathroom Shower/Tub: Producer, television/film/videoWalk-in shower   Bathroom Toilet: Standard     Home Equipment: Environmental consultantWalker - 2 wheels;Cane - single point          Prior  Functioning/Environment Level of Independence: Independent with assistive device(s)        Comments: using cane for ambulation; reports some difficulties with donning socks         OT Problem List: Decreased strength;Decreased activity tolerance;Decreased range of motion;Impaired balance (sitting and/or standing);Decreased knowledge of use of DME or AE;Decreased knowledge of precautions;Pain      OT Treatment/Interventions: Self-care/ADL training;DME and/or AE instruction;Therapeutic activities;Balance training;Therapeutic exercise;Energy conservation;Patient/family education    OT Goals(Current goals can be found in the care plan section) Acute Rehab OT Goals Patient Stated Goal: return home/regain independence  OT Goal Formulation: With patient/family Time For Goal Achievement: 09/14/17 Potential to Achieve Goals: Good  OT Frequency: Min 2X/week                             AM-PAC PT "6 Clicks" Daily Activity     Outcome Measure Help from another person eating meals?: None Help from another person taking care of personal grooming?: A Little Help from another person toileting, which includes using toliet, bedpan, or urinal?: A Little Help from another person bathing (including washing, rinsing, drying)?: A Lot Help from another person to put on and taking off regular upper body clothing?: None Help from another person to put on and taking off regular lower body clothing?: A Lot 6 Click Score: 18   End of Session Equipment Utilized During Treatment: Gait belt;Rolling walker Nurse Communication: Mobility status  Activity Tolerance: Patient tolerated treatment well Patient left: in chair;with call bell/phone within reach;with family/visitor present  OT Visit Diagnosis: Unsteadiness on feet (R26.81)                Time: 1610-9604: 0917-0943 OT Time Calculation (min): 26 min Charges:  OT General Charges $OT Visit: 1 Visit OT Evaluation $OT Eval Low Complexity: 1 Low G-Codes:      Colton Powell, OT Pager [phone] 08/31/2017   Colton Powell 08/31/2017, 10:07 AM

## 2017-08-31 NOTE — Progress Notes (Addendum)
Occupational Therapy Treatment Patient Details Name: Colton Powell MRN: [id number] DOB: 1961-05-23 Today's Date: 08/31/2017    History of present illness 57 yo male s/p L TKA   OT comments  Pt seen for second OT tx session in preparation for possible d/c home this PM. Focus of session on functional transfers and LB ADLs. Pt completing functional mobility within room and shower transfer at RW level with MinGuard this session; continues to require ModA for LB ADLs. Pt will have assist from spouse for LB ADLs PRN after return home. Will continue to follow while in acute setting to progress Pt towards established OT goals.   Follow Up Recommendations  DC plan and follow up therapy as arranged by surgeon;Supervision/Assistance - 24 hour    Equipment Recommendations  3 in 1 bedside commode;Other (comment)(bariatric 3:1 )          Precautions / Restrictions Precautions Precautions: Knee;Fall Precaution Comments: verbally reviewed  Restrictions Weight Bearing Restrictions: No LLE Weight Bearing: Weight bearing as tolerated       Mobility Bed Mobility Overal bed mobility: Needs Assistance Bed Mobility: Supine to Sit     Supine to sit: Min assist;HOB elevated     General bed mobility comments: oob in recliner  Transfers Overall transfer level: Needs assistance Equipment used: Rolling walker (2 wheeled) Transfers: Sit to/from Stand Sit to Stand: Min assist Stand pivot transfers: Min assist       General transfer comment: small amount of assist from low recliner. Increased time.                                                ADL either performed or assessed with clinical judgement   ADL Overall ADL's : Needs assistance/impaired Eating/Feeding: Modified independent;Sitting   Grooming: Min guard;Standing;Oral care            Lower Body Dressing: Moderate assistance;Sit to/from stand Lower Body Dressing Details (indicate cue type and reason):  reviewed compensatory techniques for completing LB ADLs       Tub/ Shower Transfer: Walk-in shower;Min guard;Ambulation;3 in 1;Rolling walker Tub/Shower Transfer Details (indicate cue type and reason): reviewed sequence with Pt/Pt's spouse, Pt return demonstratining with MinGuard; corresponding handout issued  Functional mobility during ADLs: Minimal assistance;Min guard;Rolling walker General ADL Comments: Pt continues to have some lightheadedness, completed room level functional mobility with MinA, progressed to MinGuard; Pt completing standing grooming ADLs and shower transfer with MinGuard during task completion      Cognition Arousal/Alertness: Awake/alert Behavior During Therapy: WFL for tasks assessed/performed Overall Cognitive Status: Within Functional Limits for tasks assessed                                                           Pertinent Vitals/ Pain       Pain Assessment: 0-10 Pain Score: 5  Pain Location: L knee  Pain Descriptors / Indicators: Sore;Aching Pain Intervention(s): Limited activity within patient's tolerance;Monitored during session;Ice applied;Premedicated before session  Home Living Family/patient expects to be discharged to:: Private residence Living Arrangements: Spouse/significant other Available Help at Discharge: Family Type of Home: House Home Access: Stairs to enter Enter[employer] CorporationEntrance Stairs-Number of Steps: small threshold   Home  Layout: One level               Home Equipment: Walker - 2 wheels;Cane - single point          Prior Functioning/Environment Level of Independence: Independent with assistive device(s)        Comments: using cane for ambulation; reports some difficulties with donning socks    Frequency  Min 2X/week        Progress Toward Goals  OT Goals(current goals can now be found in the care plan section)  Progress towards OT goals: Progressing toward goals  Acute Rehab OT  Goals Patient Stated Goal: return home/regain independence  OT Goal Formulation: With patient/family Time For Goal Achievement: 09/14/17 Potential to Achieve Goals: Good ADL Goals Pt Will Perform Grooming: with supervision;standing Pt Will Perform Upper Body Bathing: with modified independence;sitting Pt Will Perform Lower Body Bathing: with min guard assist;sit to/from stand Pt Will Perform Lower Body Dressing: with min assist;sit to/from stand Pt Will Transfer to Toilet: with supervision;ambulating;bedside commode(BSC over toilet) Pt Will Perform Toileting - Clothing Manipulation and hygiene: with supervision;sit to/from stand Pt Will Perform Tub/Shower Transfer: Shower transfer;with supervision;ambulating;3 in 1;rolling walker  Plan Discharge plan remains appropriate                     AM-PAC PT "6 Clicks" Daily Activity     Outcome Measure   Help from another person eating meals?: None Help from another person taking care of personal grooming?: A Little Help from another person toileting, which includes using toliet, bedpan, or urinal?: A Little Help from another person bathing (including washing, rinsing, drying)?: A Little Help from another person to put on and taking off regular upper body clothing?: None Help from another person to put on and taking off regular lower body clothing?: A Lot 6 Click Score: 19    End of Session Equipment Utilized During Treatment: Gait belt;Rolling walker  OT Visit Diagnosis: Unsteadiness on feet (R26.81)   Activity Tolerance Patient tolerated treatment well   Patient Left in chair;with call bell/phone within reach;with family/visitor present   Nurse Communication Mobility status        Time: 4098-1191 OT Time Calculation (min): 24 min  Charges: OT General Charges $OT Visit: 1 Visit OT Evaluation $OT Eval Low Complexity: 1 Low OT Treatments $Self Care/Home Management : 8-22 mins  Marcy Siren, OT Pager  [phone] 08/31/2017    Colton Powell 08/31/2017, 1:41 PM

## 2017-08-31 NOTE — Discharge Summary (Signed)
Physician Discharge Summary  Patient ID: Colton Powell MRN: [id number] DOB/AGE: [birth year] 57 y.o.  Admit date: 08/30/2017 Discharge date:  08/31/2017  Procedures:  Procedure(s) (LRB): LEFT TOTAL KNEE ARTHROPLASTY (Left)  Attending Physician:  Dr. Durene Romans   Admission Diagnoses:   Left knee primary OA / pain  Discharge Diagnoses:  Principal Problem:   S/P left TKA Active Problems:   S/P total knee replacement  Past Medical History:  Diagnosis Date  . High cholesterol    Takes atorvastatin  . Hypertension   . Sleep apnea    CPAP    HPI:    Colton Powell, 57 y.o. male, has a history of pain and functional disability in the left knee due to arthritis and has failed non-surgical conservative treatments for greater than 12 weeks to includeNSAID's and/or analgesics, supervised PT with diminished ADL's post treatment, use of assistive devices and activity modification.  Onset of symptoms was gradual, starting ~2 years ago with gradually worsening course since that time. The patient noted prior procedures on the knee to include  arthroscopy on the left knee(s).  Patient currently rates pain in the left knee(s) at 10 out of 10 with activity. Patient has night pain, worsening of pain with activity and weight bearing, pain that interferes with activities of daily living, pain with passive range of motion, crepitus and joint swelling.  Patient has evidence of periarticular osteophytes and joint space narrowing by imaging studies.  There is no active infection.  Risks, benefits and expectations were discussed with the patient.  Risks including but not limited to the risk of anesthesia, blood clots, nerve damage, blood vessel damage, failure of the prosthesis, infection and up to and including death.  Patient understand the risks, benefits and expectations and wishes to proceed with surgery.   PCP: System, Pcp Not In   Discharged Condition: good  Hospital Course:  Patient underwent the  above stated procedure on 08/30/2017. Patient tolerated the procedure well and brought to the recovery room in good condition and subsequently to the floor.  POD #1 BP: 140/91 ; Pulse: 64 ; Temp: 97.9 F (36.6 C) ; Resp: 15 Patient reports pain as mild to moderate.  Some nausea last evening.  CPAP on.  No events. Neurovascular intact and incision: dressing C/D/I.   LABS  Basename    HGB     13.5  HCT     42.2    Discharge Exam: General appearance: alert, cooperative and no distress Extremities: Homans sign is negative, no sign of DVT, no edema, redness or tenderness in the calves or thighs and no ulcers, gangrene or trophic changes  Disposition: Home with follow up in 2 weeks   Follow-up Information    Durene Romans, MD. Schedule an appointment as soon as possible for a visit in 2 week(s).   Specialty:  Orthopedic Surgery Contact information: [address] [place] [address] [phone]           Discharge Instructions    Call MD / Call 911   Complete by:  As directed    If you experience chest pain or shortness of breath, CALL 911 and be transported to the hospital emergency room.  If you develope a fever above 101 F, pus (white drainage) or increased drainage or redness at the wound, or calf pain, call your surgeon's office.   Change dressing   Complete by:  As directed    Maintain surgical dressing until follow up in the clinic. If  the edges start to pull up, may reinforce with tape. If the dressing is no longer working, may remove and cover with gauze and tape, but must keep the area dry and clean.  Call with any questions or concerns.   Constipation Prevention   Complete by:  As directed    Drink plenty of fluids.  Prune juice may be helpful.  You may use a stool softener, such as Colace (over the counter) 100 mg twice a day.  Use MiraLax (over the counter) for constipation as needed.   Diet - low sodium heart healthy   Complete by:  As directed     Discharge instructions   Complete by:  As directed    Maintain surgical dressing until follow up in the clinic. If the edges start to pull up, may reinforce with tape. If the dressing is no longer working, may remove and cover with gauze and tape, but must keep the area dry and clean.  [facility] Call with any questions or concerns.   Increase activity slowly as tolerated   Complete by:  As directed    Weight bearing as tolerated with assist device (walker, cane, etc) as directed, use it as long as suggested by your surgeon or therapist, typically at least 4-6 weeks.   TED hose   Complete by:  As directed    Use stockings (TED hose) for 2 weeks on both leg(s).  You may remove them at night for sleeping.      Allergies as of 08/31/2017   No Known Allergies     Medication List    STOP taking these medications   aspirin EC 81 MG tablet Replaced by:  aspirin 81 MG chewable tablet   ibuprofen 800 MG tablet Commonly known as:  ADVIL,MOTRIN     TAKE these medications   aspirin 81 MG chewable tablet Commonly known as:  ASPIRIN CHILDRENS Chew 1 tablet (81 mg total) by mouth 2 (two) times daily. Take for 4 weeks, then resume regular dose. Replaces:  aspirin EC 81 MG tablet   atorvastatin 10 MG tablet Commonly known as:  LIPITOR Take 10 mg by mouth daily.   docusate sodium 100 MG capsule Commonly known as:  COLACE Take 1 capsule (100 mg total) by mouth 2 (two) times daily.   ferrous sulfate 325 (65 FE) MG tablet Commonly known as:  FERROUSUL Take 1 tablet (325 mg total) by mouth 3 (three) times daily with meals.   hydrochlorothiazide 12.5 MG capsule Commonly known as:  MICROZIDE Take 25 mg by mouth daily.   HYDROcodone-acetaminophen 7.5-325 MG tablet Commonly known as:  NORCO Take 1-2 tablets by mouth every 4 (four) hours as needed for moderate pain.   KLOR-CON 10 10 MEQ tablet Generic drug:  potassium chloride Take 10 mEq by mouth  daily.   methocarbamol 500 MG tablet Commonly known as:  ROBAXIN Take 1 tablet (500 mg total) by mouth every 6 (six) hours as needed for muscle spasms.   metoprolol succinate 100 MG 24 hr tablet Commonly known as:  TOPROL-XL Take 100 mg by mouth daily.   ondansetron 4 MG tablet Commonly known as:  ZOFRAN Take 1 tablet (4 mg total) by mouth every 8 (eight) hours as needed for nausea.   polyethylene glycol packet Commonly known as:  MIRALAX / GLYCOLAX Take 17 g by mouth 2 (two) times daily.   tetrahydrozoline 0.05 % ophthalmic solution Place 1-2 drops into both eyes 3 (three)  times daily as needed (for dry/irritated eyes.).            Discharge Care Instructions  (From admission, onward)        Start     Ordered   08/31/17 0000  Change dressing    Comments:  Maintain surgical dressing until follow up in the clinic. If the edges start to pull up, may reinforce with tape. If the dressing is no longer working, may remove and cover with gauze and tape, but must keep the area dry and clean.  Call with any questions or concerns.   08/31/17 5409       Signed: Anastasio Auerbach. Neldon Shepard   PA-C  08/31/2017, 12:18 PM

## 2017-08-31 NOTE — Progress Notes (Signed)
Patient ID: Colton Powell, male   DOB: 09/08/1960, 57 y.o.   MRN: [id number] Subjective: 1 Day Post-Op Procedure(s) (LRB): LEFT TOTAL KNEE ARTHROPLASTY (Left)    Patient reports pain as mild to moderate.  Some nausea last evening.  CPAP on.  No events  Objective:   VITALS:   Vitals:   08/31/17 0047 08/31/17 0511  BP: 129/80 (!) 140/91  Pulse: 70 64  Resp: 14 15  Temp: 98.4 F (36.9 C) 97.9 F (36.6 C)  SpO2: 100% 100%    Neurovascular intact Incision: dressing C/D/I  LABS Recent Labs    08/31/17 0548  HGB 13.5  HCT 42.2  WBC 8.0  PLT 184    Recent Labs    08/31/17 0548  NA 141  K 3.2*  BUN 21*  CREATININE 1.07  GLUCOSE 138*    No results for input(s): LABPT, INR in the last 72 hours.   Assessment/Plan: 1 Day Post-Op Procedure(s) (LRB): LEFT TOTAL KNEE ARTHROPLASTY (Left)   Advance diet  Up with therapy  Home after therapy this am Reviewed goals Outpt PT already set up RTC in 2 weeks

## 2017-08-31 NOTE — Evaluation (Signed)
Physical Therapy Evaluation Patient Details Name: Colton Powell MRN: [id number] DOB: 06/15/1961 Today's Date: 08/31/2017   History of Present Illness  57 yo male s/p L TKA  Clinical Impression  On eval, pt required Min assist for mobility. He walked ~75 feet with a RW. Pain rated 7/10 with activity. Will progress activity as tolerated. Possible plan for d/c later today.     Follow Up Recommendations DC plan and follow up therapy as arranged by surgeon    Equipment Recommendations  None recommended by PT    Recommendations for Other Services       Precautions / Restrictions Precautions Precautions: Knee;Fall Precaution Comments: verbally reviewed  Restrictions Weight Bearing Restrictions: No LLE Weight Bearing: Weight bearing as tolerated      Mobility  Bed Mobility Overal bed mobility: Needs Assistance Bed Mobility: Supine to Sit     Supine to sit: Min assist;HOB elevated     General bed mobility comments: oob in recliner  Transfers Overall transfer level: Needs assistance Equipment used: Rolling walker (2 wheeled) Transfers: Sit to/from Stand Sit to Stand: Min assist Stand pivot transfers: Min assist       General transfer comment: small amount of assist from low recliner. Increased time.   Ambulation/Gait Ambulation/Gait assistance: Min guard Ambulation Distance (Feet): 75 Feet Assistive device: Rolling walker (2 wheeled) Gait Pattern/deviations: Step-to pattern;Trunk flexed     General Gait Details: cues for posture, and to try to walk on flat foot instead of on forefoot only. VCs safety, sequence. Slow gait speed.   Stairs            Wheelchair Mobility    Modified Rankin (Stroke Patients Only)       Balance                                             Pertinent Vitals/Pain Pain Assessment: 0-10 Pain Score: 7  Pain Location: L knee  Pain Descriptors / Indicators: Sore;Aching Pain Intervention(s): Limited activity  within patient's tolerance;Repositioned;Ice applied    Home Living Family/patient expects to be discharged to:: Private residence Living Arrangements: Spouse/significant other Available Help at Discharge: Family Type of Home: House Home Access: Stairs to enter   [employer] of Steps: small threshold Home Layout: One level Home Equipment: Environmental consultant - 2 wheels;Cane - single point      Prior Function Level of Independence: Independent with assistive device(s)         Comments: using cane for ambulation; reports some difficulties with donning socks      Hand Dominance        Extremity/Trunk Assessment   Upper Extremity Assessment Upper Extremity Assessment: Defer to OT evaluation    Lower Extremity Assessment Lower Extremity Assessment: Generalized weakness(s/p L TKA)    Cervical / Trunk Assessment Cervical / Trunk Assessment: Normal  Communication   Communication: No difficulties  Cognition Arousal/Alertness: Awake/alert Behavior During Therapy: WFL for tasks assessed/performed Overall Cognitive Status: Within Functional Limits for tasks assessed                                        General Comments      Exercises Total Joint Exercises Ankle Circles/Pumps: AROM;Both;10 reps;Supine Quad Sets: AROM;Both;10 reps;Supine Hip ABduction/ADduction: AAROM;10 reps;Supine;Left Straight Leg Raises: AAROM;Left;10 reps;Supine Knee Flexion:  AAROM;Left;10 reps;Seated Goniometric ROM: ~10-50 degrees   Assessment/Plan    PT Assessment Patient needs continued PT services  PT Problem List Decreased strength;Decreased balance;Decreased mobility;Decreased range of motion;Decreased activity tolerance;Pain;Decreased knowledge of use of DME       PT Treatment Interventions DME instruction;Gait training;Functional mobility training;Therapeutic activities;Balance training;Patient/family education;Therapeutic exercise    PT Goals (Current goals can be  found in the Care Plan section)  Acute Rehab PT Goals Patient Stated Goal: return home/regain independence  PT Goal Formulation: With patient/family Time For Goal Achievement: 09/14/17 Potential to Achieve Goals: Good    Frequency 7X/week   Barriers to discharge        Co-evaluation               AM-PAC PT "6 Clicks" Daily Activity  Outcome Measure Difficulty turning over in bed (including adjusting bedclothes, sheets and blankets)?: A Little Difficulty moving from lying on back to sitting on the side of the bed? : Unable Difficulty sitting down on and standing up from a chair with arms (e.g., wheelchair, bedside commode, etc,.)?: Unable Help needed moving to and from a bed to chair (including a wheelchair)?: A Little Help needed walking in hospital room?: A Little Help needed climbing 3-5 steps with a railing? : A Lot 6 Click Score: 13    End of Session Equipment Utilized During Treatment: Gait belt Activity Tolerance: Patient tolerated treatment well Patient left: in chair;with call bell/phone within reach;with family/visitor present   PT Visit Diagnosis: Pain;Difficulty in walking, not elsewhere classified (R26.2) Pain - Right/Left: Left Pain - part of body: Knee    Time: [id number] PT Time Calculation (min) (ACUTE ONLY): 25 min   Charges:   PT Evaluation $PT Eval Low Complexity: 1 Low PT Treatments $Gait Training: 8-22 mins   PT G Codes:          Rebeca AlertJannie Leilanni Halvorson, MPT Pager: [phone]

## 2017-08-31 NOTE — Progress Notes (Signed)
Physical Therapy Treatment Patient Details Name: Colton JacquetRobert Powell MRN: [id number] DOB: [birth year] Today's Date: 08/31/2017    History of Present Illness 57 yo male s/p L TKA    PT Comments    Progressing with mobility. Pt c/o some nausea prior and during session (RN premedicated for nausea prior to session). Reviewed gait training. Pt and wife report they will use entrance without stairs to enter home. Issued HEP for pt to perform 2x/day until he begins OP PT. All education completed-okay to d/c from PT standpoint.    Follow Up Recommendations  Follow surgeon's recommendation for DC plan and follow-up therapies     Equipment Recommendations  None recommended by PT    Recommendations for Other Services       Precautions / Restrictions Precautions Precautions: Knee;Fall Precaution Comments: verbally reviewed  Restrictions Weight Bearing Restrictions: No LLE Weight Bearing: Weight bearing as tolerated    Mobility  Bed Mobility               General bed mobility comments: oob in recliner  Transfers Overall transfer level: Needs assistance Equipment used: Rolling walker (2 wheeled) Transfers: Sit to/from Stand Sit to Stand: Min guard         General transfer comment: Increased time. close guard for safety.   Ambulation/Gait Ambulation/Gait assistance: Min guard Ambulation Distance (Feet): 85 Feet Assistive device: Rolling walker (2 wheeled) Gait Pattern/deviations: Step-to pattern     General Gait Details: cues for posture, and to try to walk on flat foot instead of on forefoot only. VCs safety, sequence. Slow gait speed.    Stairs            Wheelchair Mobility    Modified Rankin (Stroke Patients Only)       Balance                                            Cognition Arousal/Alertness: Awake/alert Behavior During Therapy: WFL for tasks assessed/performed Overall Cognitive Status: Within Functional Limits for tasks  assessed                                        Exercises   General Comments        Pertinent Vitals/Pain Pain Assessment: 0-10 Pain Score: 6  Pain Location: L knee  Pain Descriptors / Indicators: Sore;Aching Pain Intervention(s): Monitored during session;Limited activity within patient's tolerance;Repositioned;Ice applied    Home Living Family/patient expects to be discharged to:: Private residence Living Arrangements: Spouse/significant other Available Help at Discharge: Family Type of Home: House Home Access: Stairs to enter   Home Layout: One level Home Equipment: Environmental consultantWalker - 2 wheels;Cane - single point      Prior Function Level of Independence: Independent with assistive device(s)      Comments: using cane for ambulation; reports some difficulties with donning socks    PT Goals (current goals can now be found in the care plan section) Acute Rehab PT Goals Patient Stated Goal: return home/regain independence  PT Goal Formulation: With patient/family Time For Goal Achievement: 09/14/17 Potential to Achieve Goals: Good Progress towards PT goals: Progressing toward goals    Frequency    7X/week      PT Plan Current plan remains appropriate    Co-evaluation  AM-PAC PT "6 Clicks" Daily Activity  Outcome Measure  Difficulty turning over in bed (including adjusting bedclothes, sheets and blankets)?: A Little Difficulty moving from lying on back to sitting on the side of the bed? : A Little Difficulty sitting down on and standing up from a chair with arms (e.g., wheelchair, bedside commode, etc,.)?: A Little Help needed moving to and from a bed to chair (including a wheelchair)?: A Little Help needed walking in hospital room?: A Little Help needed climbing 3-5 steps with a railing? : A Lot 6 Click Score: 17    End of Session Equipment Utilized During Treatment: Gait belt Activity Tolerance: Patient tolerated treatment  well Patient left: in chair;with call bell/phone within reach;with family/visitor present   PT Visit Diagnosis: Pain;Difficulty in walking, not elsewhere classified (R26.2) Pain - Right/Left: Left Pain - part of body: Knee     Time: 1610-9604 PT Time Calculation (min) (ACUTE ONLY): 14 min  Charges:  $Gait Training: 8-22 mins                    G Codes:          Rebeca Alert, MPT Pager: [phone]

## 2022-11-02 ENCOUNTER — Other Ambulatory Visit (HOSPITAL_COMMUNITY): Payer: Self-pay | Admitting: General Surgery

## 2022-11-16 ENCOUNTER — Encounter: Payer: Medicare (Managed Care) | Attending: General Surgery | Admitting: Dietician

## 2022-11-16 ENCOUNTER — Encounter: Payer: Self-pay | Admitting: Dietician

## 2022-11-16 VITALS — Ht 71.0 in | Wt 299.0 lb

## 2022-11-16 DIAGNOSIS — I10 Essential (primary) hypertension: Secondary | ICD-10-CM | POA: Diagnosis not present

## 2022-11-16 DIAGNOSIS — E669 Obesity, unspecified: Secondary | ICD-10-CM

## 2022-11-16 DIAGNOSIS — Z713 Dietary counseling and surveillance: Secondary | ICD-10-CM | POA: Diagnosis not present

## 2022-11-16 DIAGNOSIS — E785 Hyperlipidemia, unspecified: Secondary | ICD-10-CM | POA: Insufficient documentation

## 2022-11-16 DIAGNOSIS — G4730 Sleep apnea, unspecified: Secondary | ICD-10-CM | POA: Insufficient documentation

## 2022-11-16 DIAGNOSIS — Z6841 Body Mass Index (BMI) 40.0 and over, adult: Secondary | ICD-10-CM | POA: Insufficient documentation

## 2022-11-16 NOTE — Progress Notes (Signed)
Nutrition Assessment for Bariatric Surgery: Pre-Surgery Behavioral and Nutrition Intervention Program   Medical Nutrition Therapy  Appt Start Time: 11:29    End Time: 12.45  Patient was seen on 11/16/2022 for Pre-Operative Nutrition Assessment. Purpose of todays visit  enhance perioperative outcomes along with a healthy weight maintenance   Referral stated Supervised Weight Loss (SWL) visits needed: 6 months  Planned surgery: RYGB Pt expectation of surgery: lose about 50-60 lbs   NUTRITION ASSESSMENT   Anthropometrics  Start weight at [facility]: 299.0 lbs (date: 11/16/2022)  Height: 71 in BMI: 41.70 kg/m2     Clinical   Pharmacotherapy: History of weight loss medication used: none  Medical hx: hyperlipidemia, sleep apnea, HTN Medications: atorvastatin, metformin, losartan, jardiance, metoprolol succinate   Labs: A1c 7.8; glucose 146 Notable signs/symptoms: none noted Any previous deficiencies? No  Evaluation of Nutritional Deficiencies: Micronutrient Nutrition Focused Physical Exam: Hair: No issues observed Eyes: No issues observed Mouth: No issues observed Neck: No issues observed Nails: No issues observed Skin: No issues observed  Lifestyle & Dietary Hx  Pt states he checks his blood sugar a couple of times a week, stating fasting in the morning, with a reading around 135-155. Pt states he tries to move around as much as he can, stating he is needing a knee replacement. Pt states he is a night owl, but needs to get up early to help take care of his mother. Pt states he uses a CPAP machine. Pt states most meals are dining at a restaurant.  Current Physical Activity Recommendations state 150 minutes per week of moderate to vigorous movement including Cardio and 1-2 days of resistance activities as well as flexibility/balance activities:  Pts current physical activity: a little walking or bicycle 2 days a week, with 10-30% recommendation reached   Sleep Hygiene: duration  and quality: okay, 5-6 hours a night, uses CPAP  Current Patient Perceived Stress Level as stated by pt on a scale of 1-10: 1       Stress Management Techniques: positive attitude  According to the Dietary Guidelines for Americans Recommendation: equivalent 1.5-2 cups fruits per day, equivalent 2-3 cups vegetables per day and at least half all grains whole  Fruit servings per day (on average): 0-1, meeting 0-33% recommendation  Non-starchy vegetable servings per day (on average): 0-1, meeting 0-50% recommendation  Whole Grains per day (on average): 0  Number of meals missed/skipped per week out of 21: 6  24-Hr Dietary Recall First Meal: skip or breakfast bar Snack:  Second Meal: skip or hot dog or chicken tenders or vegetable plate or meat and vegetables Snack:   Third Meal: meat and vegetables or hot dog or chicken tenders or grilled chicken or salmon at home Snack: maybe small portion controlled snack bags or nabs Beverages: water, small soda can with zero sugar, tea (half sweet half un-sweet)  Alcoholic beverages per week: 1-2   Estimated Energy Needs Calories: 1800  NUTRITION DIAGNOSIS  Overweight/obesity ([place]-3.3) related to past poor dietary habits and physical inactivity as evidenced by patient w/ planned RYGB surgery following dietary guidelines for continued weight loss.    NUTRITION INTERVENTION  Nutrition counseling (C-1) and education (E-2) to facilitate bariatric surgery goals.  Educated pt on micronutrient deficiencies post-surgery and behavioral/dietary strategies to start in order to mitigate that risk   Behavioral and Dietary Interventions Pre-Op Goals Reviewed with the Patient Nutrition: Healthy Eating Behaviors Switch to non-caloric, non-carbonated and non-caffeinated beverages such as  water, unsweetened tea, Crystal Light and zero  calorie beverages (aim for 64 oz. per day) Cut out grazing between meals or at night  Find a protein shake you like Eat every  3-5 hours        Eliminate distractions while eating (TV, computer, reading, driving, texting) Take 95-62 minutes to eat a meal  Decrease high sugar foods/decrease high fat/fried foods Eliminate alcoholic beverages Increase protein intake (eggs, fish, chicken, yogurt) before surgery Eat non starchy vegetables 2 times a day 7 days a week Eat complex carbohydrates such as whole grains and fruits   Behavioral Modification: Physical Activity Increase my usual daily activity (use stairs, park farther, etc.) Engage in _______________________  activity  _______ minutes ______ times per week  Other:    _________________________________________________________________     Problem Solving I will think about my usual eating patterns and how to tweak them How can my friends and family support me Barriers to starting my changes Learn and understand appetite verses hunger   Healthy Coping Allow for ___________ activities per week to help me manage stress Reframe negative thoughts I will keep a picture of someone or something that is my inspiration & look at it daily   Monitoring  Weigh myself once a week  Measure my progress by monitoring how my clothes fit Keep a food record of what I eat and drink for the next ________ (time period) Take pictures of what I eat and drink for the next ________ (time period) Use an app to count steps/day for the next_______ (time period) Measure my progress such as increased energy and more restful sleep Monitor your acid reflux and bowel habits, are they getting better?   *Goals that are bolded indicate the pt would like to start working towards these  Handouts Provided Include  Bariatric Surgery handouts (Nutrition Visits, Pre Surgery Behavioral Change Goals, Protein Shakes Brands to Choose From, Vitamins & Mineral Supplementation)  Learning Style & Readiness for Change Teaching method utilized: Visual, Auditory, and hands on  Demonstrated degree of  understanding via: Teach Back  Readiness Level: preparation Barriers to learning/adherence to lifestyle change: nothing identified  RD's Notes for Next Visit Patient progress toward chosen goals   MONITORING & EVALUATION Dietary intake, weekly physical activity, body weight, and preoperative behavioral change goals   Next Steps  Patient is to follow up at [facility] in two weeks for first SWL visit.

## 2022-11-30 ENCOUNTER — Encounter: Payer: Self-pay | Admitting: Dietician

## 2022-11-30 ENCOUNTER — Encounter: Payer: Medicare (Managed Care) | Attending: General Surgery | Admitting: Dietician

## 2022-11-30 VITALS — Ht 71.0 in | Wt 297.3 lb

## 2022-11-30 DIAGNOSIS — Z6841 Body Mass Index (BMI) 40.0 and over, adult: Secondary | ICD-10-CM | POA: Diagnosis not present

## 2022-11-30 DIAGNOSIS — E669 Obesity, unspecified: Secondary | ICD-10-CM | POA: Diagnosis present

## 2022-11-30 DIAGNOSIS — Z713 Dietary counseling and surveillance: Secondary | ICD-10-CM | POA: Diagnosis not present

## 2022-11-30 NOTE — Progress Notes (Signed)
Supervised Weight Loss Visit Bariatric Nutrition Education Appt Start Time: 11:11    End Time: 11:50  Planned surgery: RYGB Pt expectation of surgery: lose about 50-60 lbs  NUTRITION ASSESSMENT  Anthropometrics  Start weight at [facility]: 299.0 lbs (date: 11/16/2022)  Height: 71 in Weight today: 297.3 lb BMI: 41.46 kg/m2     Clinical   Pharmacotherapy: History of weight loss medication used: none  Medical hx: hyperlipidemia, sleep apnea, HTN Medications: atorvastatin, metformin, losartan, jardiance, metoprolol succinate  Labs: A1c 7.8; glucose 146 Notable signs/symptoms: none noted Any previous deficiencies? No  Lifestyle & Dietary Hx  Pt states he is getting about 4-5 bottles of water a day. Pt agreeable to practice not drinking with meals.   Estimated daily fluid intake: 64 oz Supplements: n/a Current average weekly physical activity: walking 2-3 times a week for 15-30 minutes or stationary bicycle  24-Hr Dietary Recall First Meal: skip or breakfast bar Snack:  Second Meal: skip or hot dog or chicken tenders or vegetable plate or meat and vegetables Snack:   Third Meal: meat and vegetables or hot dog or chicken tenders or grilled chicken or salmon at home Snack: maybe small portion controlled snack bags or nabs Beverages: water, lemonade, small soda can with zero sugar, tea (half sweet half un-sweet)  Estimated Energy Needs Calories: 2000   NUTRITION DIAGNOSIS  Overweight/obesity ([place]-3.3) related to past poor dietary habits and physical inactivity as evidenced by patient w/ planned RYGB surgery following dietary guidelines for continued weight loss.  NUTRITION INTERVENTION  Nutrition counseling (C-1) and education (E-2) to facilitate bariatric surgery goals.  Encouraged patient to honor their body's internal hunger and fullness cues.  Throughout the day, check in mentally and rate hunger. Stop eating when satisfied not full regardless of how much food is left on the  plate.  Get more if still hungry 20-30 minutes later.  The key is to honor satisfaction so throughout the meal, rate fullness factor and stop when comfortably satisfied not physically full. The key is to honor hunger and fullness without any feelings of guilt or shame.  Pay attention to what the internal cues are, rather than any external factors. This will enhance the confidence you have in listening to your own body and following those internal cues enabling you to increase how often you eat when you are hungry not out of appetite and stop when you are satisfied not full.  Encouraged pt to continue to eat balanced meals inclusive of non starchy vegetables 2 times a day 7 days a week Encouraged pt to choose lean protein sources: limiting beef, pork, sausage, hotdogs, and lunch meat Encourage pt to choose healthy fats such as plant based limiting animal fats Encouraged pt to continue to drink a minium 64 fluid ounces with half being plain water to satisfy proper hydration. Why you need complex carbohydrates: Whole grains and other complex carbohydrates are required to have a healthy diet. Whole grains provide fiber which can help with blood glucose levels and help keep you satiated. Fruits and starchy vegetables provide essential vitamins and minerals required for immune function, eyesight support, brain support, bone density, wound healing and many other functions within the body. According to the current evidenced based 2020-2025 Dietary Guidelines for Americans, complex carbohydrates are part of a healthy eating pattern which is associated with a decreased risk for type 2 diabetes, cancers, and cardiovascular disease.   Pre-Op Goals Progress & New Goals Eat every 3-5 hours; maintain consistent routine for meals and snacks  Practice not drinking with meals or snacks; wait 15 minutes before and 30 minutes after Take 20-30 minutes to eat; aim for feeling of satisfaction vs fullness  Handouts Provided  Include  Meal Ideas Handout  Learning Style & Readiness for Change Teaching method utilized: Visual & Auditory  Demonstrated degree of understanding via: Teach Back  Readiness Level: preparation Barriers to learning/adherence to lifestyle change: mobility, bad knee  RD's Notes for next Visit  Patient progress toward chosen goals.  MONITORING & EVALUATION Dietary intake, weekly physical activity, body weight, and pre-op goals in 1 month.   Next Steps  Patient is to return to [facility] in 1 month for next SWL visit.

## 2023-01-04 ENCOUNTER — Encounter: Payer: Self-pay | Admitting: Dietician

## 2023-01-04 ENCOUNTER — Encounter: Payer: Medicare (Managed Care) | Attending: General Surgery | Admitting: Dietician

## 2023-01-04 VITALS — Ht 71.0 in | Wt 290.8 lb

## 2023-01-04 DIAGNOSIS — Z6841 Body Mass Index (BMI) 40.0 and over, adult: Secondary | ICD-10-CM | POA: Diagnosis not present

## 2023-01-04 DIAGNOSIS — E669 Obesity, unspecified: Secondary | ICD-10-CM | POA: Insufficient documentation

## 2023-01-04 NOTE — Progress Notes (Signed)
Supervised Weight Loss Visit Bariatric Nutrition Education Appt Start Time: 12:09    End Time: 12:54  Planned surgery: RYGB Pt expectation of surgery: lose about 50-60 lbs  NUTRITION ASSESSMENT  Anthropometrics  Start weight at [facility]: 299.0 lbs (date: 11/16/2022)  Height: 71 in Weight today: 290.8 lb BMI: 40.56 kg/m2     Clinical   Pharmacotherapy: History of weight loss medication used: none  Medical hx: hyperlipidemia, sleep apnea, HTN Medications: atorvastatin, metformin, losartan, jardiance, metoprolol succinate  Labs: A1c 7.8; glucose 146 Notable signs/symptoms: none noted Any previous deficiencies? No  Lifestyle & Dietary Hx  Pt arrived with wife. Pt states he has been eating more frequently throughout the day, stating he is working on not drinking with meals or snacks. Pt agreeable to track protein, aiming for 80 grams per day.  Estimated daily fluid intake: 64 oz Supplements: n/a Current average weekly physical activity: walking 2-3 times a week for 15-30 minutes or stationary bicycle  24-Hr Dietary Recall First Meal: skip or breakfast bar Snack:  Second Meal: skip or hot dog or chicken tenders or vegetable plate or meat and vegetables Snack:   Third Meal: meat and vegetables or hot dog or chicken tenders or grilled chicken or salmon at home Snack: maybe small portion controlled snack bags or nabs Beverages: water, lemonade, small soda can with zero sugar, tea (half sweet half un-sweet)  Estimated Energy Needs Calories: 2000   NUTRITION DIAGNOSIS  Overweight/obesity ([place]-3.3) related to past poor dietary habits and physical inactivity as evidenced by patient w/ planned RYGB surgery following dietary guidelines for continued weight loss.  NUTRITION INTERVENTION  Nutrition counseling (C-1) and education (E-2) to facilitate bariatric surgery goals.  Encouraged patient to honor their body's internal hunger and fullness cues.  Throughout the day, check in  mentally and rate hunger. Stop eating when satisfied not full regardless of how much food is left on the plate.  Get more if still hungry 20-30 minutes later.  The key is to honor satisfaction so throughout the meal, rate fullness factor and stop when comfortably satisfied not physically full. The key is to honor hunger and fullness without any feelings of guilt or shame.  Pay attention to what the internal cues are, rather than any external factors. This will enhance the confidence you have in listening to your own body and following those internal cues enabling you to increase how often you eat when you are hungry not out of appetite and stop when you are satisfied not full.  Encouraged pt to continue to eat balanced meals inclusive of non starchy vegetables 2 times a day 7 days a week Encouraged pt to choose lean protein sources: limiting beef, pork, sausage, hotdogs, and lunch meat Encourage pt to choose healthy fats such as plant based limiting animal fats Encouraged pt to continue to drink a minium 64 fluid ounces with half being plain water to satisfy proper hydration. Why you need complex carbohydrates: Whole grains and other complex carbohydrates are required to have a healthy diet. Whole grains provide fiber which can help with blood glucose levels and help keep you satiated. Fruits and starchy vegetables provide essential vitamins and minerals required for immune function, eyesight support, brain support, bone density, wound healing and many other functions within the body. According to the current evidenced based 2020-2025 Dietary Guidelines for Americans, complex carbohydrates are part of a healthy eating pattern which is associated with a decreased risk for type 2 diabetes, cancers, and cardiovascular disease.   Pre-Op  Goals Progress & New Goals Eat every 3-5 hours; maintain consistent routine for meals and snacks Practice not drinking with meals or snacks; wait 15 minutes before and 30  minutes after Take 20-30 minutes to eat; aim for feeling of satisfaction vs fullness New: track protein; aim for minimum of 80 grams per day.  Handouts Provided Include  Bariatric MyPlate Protein Foods List  Learning Style & Readiness for Change Teaching method utilized: Visual & Auditory  Demonstrated degree of understanding via: Teach Back  Readiness Level: preparation Barriers to learning/adherence to lifestyle change: mobility, bad knee  RD's Notes for next Visit  Patient progress toward chosen goals.  MONITORING & EVALUATION Dietary intake, weekly physical activity, body weight, and pre-op goals in 1 month.   Next Steps  Patient is to return to [facility] in 1 month for next SWL visit.

## 2023-02-01 ENCOUNTER — Ambulatory Visit (HOSPITAL_COMMUNITY)
Admission: RE | Admit: 2023-02-01 | Discharge: 2023-02-01 | Disposition: A | Discharge status: Home / Self Care | Payer: PRIVATE HEALTH INSURANCE | Source: Ambulatory Visit | Attending: General Surgery | Admitting: General Surgery

## 2023-02-02 ENCOUNTER — Encounter: Payer: Self-pay | Admitting: Dietician

## 2023-02-02 ENCOUNTER — Encounter: Payer: PRIVATE HEALTH INSURANCE | Attending: General Surgery | Admitting: Dietician

## 2023-02-02 VITALS — Ht 71.0 in | Wt 295.5 lb

## 2023-02-02 DIAGNOSIS — E669 Obesity, unspecified: Secondary | ICD-10-CM | POA: Diagnosis present

## 2023-02-02 NOTE — Progress Notes (Signed)
Supervised Weight Loss Visit Bariatric Nutrition Education Appt Start Time: 11:52    End Time: 12:31  3 out of 6 SWL Appointments   Planned surgery: RYGB Pt expectation of surgery: lose about 50-60 lbs  NUTRITION ASSESSMENT  Anthropometrics  Start weight at [facility]: 299.0 lbs (date: 11/16/2022)  Height: 71 in Weight today: 295.5 lb BMI: 41.21 kg/m2     Clinical   Pharmacotherapy: History of weight loss medication used: none  Medical hx: hyperlipidemia, sleep apnea, HTN Medications: atorvastatin, metformin, losartan, jardiance, metoprolol succinate  Labs: A1c 7.8; glucose 146 Notable signs/symptoms: none noted Any previous deficiencies? No  Lifestyle & Dietary Hx  Pt states he just returned from vacation to [place], stating there was a lot of food. Pt states he has been tracking protein. Pt states when he has gone out to eat lately, he has been splitting entrees to practice portion control. Pt states his wife has been very supportive.  Estimated daily fluid intake: 64 oz Supplements: n/a Current average weekly physical activity: walking 2-3 times a week for 15-30 minutes or stationary bicycle  24-Hr Dietary Recall First Meal: protein bar or yogurt or egg, grits, sausage (small portions) Snack:  Second Meal: salad or chicken tenders or vegetable plate or meat and vegetables or protein bar Snack:   Third Meal: meat and vegetables, grilled chicken or salmon at home or vegetable plate Snack: maybe small portion controlled snack bags or nabs Beverages: water, lemonade, small soda can with zero sugar, tea (half sweet half un-sweet), vitamin water zero, mini can soda  Estimated Energy Needs Calories: 2000   NUTRITION DIAGNOSIS  Overweight/obesity ([place]-3.3) related to past poor dietary habits and physical inactivity as evidenced by patient w/ planned RYGB surgery following dietary guidelines for continued weight loss.  NUTRITION INTERVENTION  Nutrition counseling (C-1) and  education (E-2) to facilitate bariatric surgery goals.  Encouraged patient to honor their body's internal hunger and fullness cues.  Throughout the day, check in mentally and rate hunger. Stop eating when satisfied not full regardless of how much food is left on the plate.  Get more if still hungry 20-30 minutes later.  The key is to honor satisfaction so throughout the meal, rate fullness factor and stop when comfortably satisfied not physically full. The key is to honor hunger and fullness without any feelings of guilt or shame.  Pay attention to what the internal cues are, rather than any external factors. This will enhance the confidence you have in listening to your own body and following those internal cues enabling you to increase how often you eat when you are hungry not out of appetite and stop when you are satisfied not full.  Encouraged pt to continue to eat balanced meals inclusive of non starchy vegetables 2 times a day 7 days a week Encouraged pt to choose lean protein sources: limiting beef, pork, sausage, hotdogs, and lunch meat Encourage pt to choose healthy fats such as plant based limiting animal fats Encouraged pt to continue to drink a minium 64 fluid ounces with half being plain water to satisfy proper hydration. Why you need complex carbohydrates: Whole grains and other complex carbohydrates are required to have a healthy diet. Whole grains provide fiber which can help with blood glucose levels and help keep you satiated. Fruits and starchy vegetables provide essential vitamins and minerals required for immune function, eyesight support, brain support, bone density, wound healing and many other functions within the body. According to the current evidenced based 2020-2025 Dietary Guidelines  for Americans, complex carbohydrates are part of a healthy eating pattern which is associated with a decreased risk for type 2 diabetes, cancers, and cardiovascular disease.   Pre-Op Goals Progress  & New Goals Eat every 3-5 hours; maintain consistent routine for meals and snacks Practice not drinking with meals or snacks; wait 15 minutes before and 30 minutes after Take 20-30 minutes to eat; aim for feeling of satisfaction vs fullness Continue: track protein; aim for minimum of 80 grams per day. New: avoid sweetened beverages; 0-5 calories, no sugar; no juice  Handouts Provided Include    Learning Style & Readiness for Change Teaching method utilized: Visual & Auditory  Demonstrated degree of understanding via: Teach Back  Readiness Level: preparation Barriers to learning/adherence to lifestyle change: mobility, bad knee  RD's Notes for next Visit  Patient progress toward chosen goals.  MONITORING & EVALUATION Dietary intake, weekly physical activity, body weight, and pre-op goals in 1 month.   Next Steps  Patient is to return to [facility] in 1 month for next SWL visit.

## 2023-02-09 ENCOUNTER — Ambulatory Visit (INDEPENDENT_AMBULATORY_CARE_PROVIDER_SITE_OTHER): Payer: PRIVATE HEALTH INSURANCE | Admitting: Licensed Clinical Social Worker

## 2023-02-09 DIAGNOSIS — F4320 Adjustment disorder, unspecified: Secondary | ICD-10-CM

## 2023-02-10 NOTE — Progress Notes (Unsigned)
Comprehensive Clinical Assessment (CCA) Note  02/10/2023 Colton Powell [id number]  Chief Complaint:  Chief Complaint  Patient presents with   Obesity   Visit Diagnosis: Adjustment disorder, unspecified type     CCA Biopsychosocial Intake/Chief Complaint:  Bariatric  Current Symptoms/Problems: minor issues with sleep, smokes cigars a few times a week, No SI/HI, no psychosis   Patient Reported Schizophrenia/Schizoaffective Diagnosis in Past: No   Strengths: hardworker, happy go lucky, looks on the bright side of things, family oriented  Preferences: doesn't prefer conflict/nonsense or gossiping, prefers family, prefers the outdoors, prefers the beach, prefers sports, doesn't prefer large crowds  Abilities: good at sports: basketball/football, can be good with hands   Type of Services Patient Feels are Needed: Bariatric   Initial Clinical Notes/Concerns: History of weight: Weight increased after retirement,  Weight loss attempts: Slim fast, exercise, reduce portion size,  current diet: reduction of portion size, reducing starches and sweets, high protein, low protein, reducing soda and tea, increasing water intake, Co-morbid: sleep apnea, diabetes, high blood pressure, high cholesterol,    previous procedures: fusion in back:2009, knee replacement: 2018, hernia surery 2016, recovered well,   family history: Grandfather, mother used to be obese, Sister,   Mental Health Symptoms Depression:   Sleep (too much or little)   Duration of Depressive symptoms:  Greater than two weeks   Mania:   None   Anxiety:    None   Psychosis:  No data recorded  Duration of Psychotic symptoms: No data recorded  Trauma:   None   Obsessions:   None   Compulsions:   None   Inattention:   None   Hyperactivity/Impulsivity:   None   Oppositional/Defiant Behaviors:   None   Emotional Irregularity:   None   Other Mood/Personality Symptoms:   None    Mental Status  Exam Appearance and self-care  Stature:   Average   Weight:   Obese   Clothing:   Casual   Grooming:   Normal   Cosmetic use:   None   Posture/gait:   Normal   Motor activity:   Not Remarkable   Sensorium  Attention:   Normal   Concentration:   Normal   Orientation:   X5   Recall/memory:   Normal   Affect and Mood  Affect:   Appropriate   Mood:   Euthymic   Relating  Eye contact:   Normal   Facial expression:   Responsive   Attitude toward examiner:   Cooperative   Thought and Language  Speech flow:  Clear and Coherent   Thought content:   Appropriate to Mood and Circumstances   Preoccupation:   None   Hallucinations:   None   Organization:  No data recorded  [employer] of Knowledge:   Good   Intelligence:   Average   Abstraction:   Normal   Judgement:   Good   Reality Testing:   Adequate   Insight:   Good   Decision Making:   Normal   Social Functioning  Social Maturity:   Responsible   Social Judgement:   Normal   Stress  Stressors:  No data recorded  Coping Ability:   Normal   Skill Deficits:   None   Supports:   Family; Church     Religion: Religion/Spirituality Are You A Religious Person?: Yes What is Your Religious Affiliation?: Christian How Might This Affect Treatment?: Support in treatment  Leisure/Recreation: Leisure / Recreation Do You Have Hobbies?:  Yes Leisure and Hobbies: music, golf  Exercise/Diet: Exercise/Diet Do You Exercise?: Yes What Type of Exercise Do You Do?: Bike, Swimming How Many Times a Week Do You Exercise?: 1-3 times a week Have You Gained or Lost A Significant Amount of Weight in the Past Six Months?: No Do You Follow a Special Diet?: Yes Type of Diet: see above Do You Have Any Trouble Sleeping?: Yes Explanation of Sleeping Difficulties: sleep apnea, has c-pap, occasional difficulty falling asleep due to heat   CCA  Employment/Education Employment/Work Situation: Employment / Work Psychologist, occupational Employment Situation: Retired Passenger transport manager has Been Impacted by Current Illness: No What is the Longest Time Patient has Held a Job?: 38 Where was the Patient Employed at that Time?: [employer] Has Patient ever Been in the [employer]?: No  Education: Education Is Patient Currently Attending School?: No Last Grade Completed: 12 Name of High School: [employer] Did Garment/textile technologist From [employer]?: Yes Did Theme park manager?: No Did Designer, television/film set?: No Did You Have Any Special Interests In School?: Math, PE Did You Have An Individualized Education Program (IIEP): No Did You Have Any Difficulty At School?: No Patient's Education Has Been Impacted by Current Illness: No   CCA Family/Childhood History Family and Relationship History: Family history Marital status: Married Number of Years Married: 25 What types of issues is patient dealing with in the relationship?: None Additional relationship information: None Are you sexually active?: Yes What is your sexual orientation?: Heterosexual Has your sexual activity been affected by drugs, alcohol, medication, or emotional stress?: None Does patient have children?: Yes How many children?: 1 How is patient's relationship with their children?: Daughter: good  Childhood History:  Childhood History By whom was/is the patient raised?: Both parents Additional childhood history information: Both parents in the home. Patient describes childhood as "awesome." Description of patient's relationship with caregiver when they were a child: Mother: good, Father: good Patient's description of current relationship with people who raised him/her: Mother: good, Father: deceased How were you disciplined when you got in trouble as a child/adolescent?: talked to, spanked Does patient have siblings?: Yes Number of Siblings: 3 Description of patient's current  relationship with siblings: 2 brothers, 1 sister: good Did patient suffer any verbal/emotional/physical/sexual abuse as a child?: No Did patient suffer from severe childhood neglect?: No Has patient ever been sexually abused/assaulted/raped as an adolescent or adult?: No Was the patient ever a victim of a crime or a disaster?: No Witnessed domestic violence?: No Has patient been affected by domestic violence as an adult?: No  Child/Adolescent Assessment:     CCA Substance Use Alcohol/Drug Use: Alcohol / Drug Use Pain Medications: See patient MAR Prescriptions: See patient MAR Over the Counter: See patient MAR History of alcohol / drug use?: No history of alcohol / drug abuse                         ASAM's:  Six Dimensions of Multidimensional Assessment  Dimension 1:  Acute Intoxication and/or Withdrawal Potential:   Dimension 1:  Description of individual's past and current experiences of substance use and withdrawal: None  Dimension 2:  Biomedical Conditions and Complications:   Dimension 2:  Description of patient's biomedical conditions and  complications: None  Dimension 3:  Emotional, Behavioral, or Cognitive Conditions and Complications:  Dimension 3:  Description of emotional, behavioral, or cognitive conditions and complications: None  Dimension 4:  Readiness to Change:  Dimension 4:  Description of  Readiness to Change criteria: None  Dimension 5:  Relapse, Continued use, or Continued Problem Potential:  Dimension 5:  Relapse, continued use, or continued problem potential critiera description: None  Dimension 6:  Recovery/Living Environment:  Dimension 6:  Recovery/Iiving environment criteria description: None  ASAM Severity Score: ASAM's Severity Rating Score: 0  ASAM Recommended Level of Treatment:     Substance use Disorder (SUD)    Recommendations for Services/Supports/Treatments: Recommendations for Services/Supports/Treatments Recommendations For  Services/Supports/Treatments: Other (Comment) (Bariatric)  DSM5 Diagnoses: Patient Active Problem List   Diagnosis Date Noted   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  Patient is married and has a daughter. The patient is 5 feet 11 inches tall and 299 lbs., placing him at a BMI of 41.7 classifying him in the obese range and at further risk of co-morbid diseases.  Weight History:  Patient's weight started to increase after he retired. He has tried slim fast, exercise, and reduce portion size with limited success.   Eating Patterns:  Patient has been focusing on portion size, reducing starch and sweets, and reducing soda/tea as well as increasing water intake.   Related Medical Issues:   Patient has been diagnosed with sleep apnea, diabetes, high blood pressure, and high cholesterol. Patient had a fusion in his back in 2009, a knee replacement in 2018, hernia surgery in 2016, and he recovered well from each procedure.   Family History of Obesity:  Patient's grandfather, and sister are obese. His mother used to be obese.   Tobacco Use: Patient admits to smoking cigars.   PATIENT BEHAVIORAL ASSESSMENT SCORES  Personal History of Mental Illness: Patient denies treatment for depression and anxiety.   Mental Status Examination: Patient was oriented x5 (person,  place, situation, time, and object). He was appropriately groomed, and neatly dressed. Patient was alert, engaged, pleasant, and cooperative. Patient denies suicidal and homicidal ideations. Patient denies self-injury. Patient denies psychosis including auditory and visual hallucinations  DSM-5-TR Self-Rated Level 1 Cross-Cutting Symptom Measure--Adult:  Patient rated himself a 0 on the depression and anxiety domain.   Severity Measure for Generalized Anxiety Disorder--Adult: Patient completed a 10-question scale. Total scores can range from 0 to 40. A raw score is calculated by summing the answer to each question, and an average total score is achieved by dividing the raw score by the number of items (e.g., 10). Patient had a total raw score of 2 out of 40 which was divided by the total number of questions answered (10) to get an average score of . 2 which indicates no significant anxiety.   EAT-26: The EAT-26 is a twenty-six-question screening tool to identify symptoms of eating disorders and disordered eating. The patient scored 5 out of 26. Scores below a 20 are considered not meeting criteria for disordered eating. Patient denies inducing vomiting, or intentional meal skipping. Patient denies binge eating behaviors. Patient denies laxative abuse. Patient  does not meet criteria for a DSM-V eating disorder.  Conclusion & Recommendations:   Kelsey Edman" [facility]'s health history and current assessment indicate that she is suitable for bariatric surgery. Patient understands the procedure, the risks associated with it, and the importance of post-operative holistic care (Physical, Spiritual/Values, Relationships, and Mental/Emotional health) with access to resources for support as needed. The patient has made an informed decision to proceed with the procedure. The patient is motivated and expressed understanding of the post-surgical requirements. Patient's psychological assessment will be valid from today's  date for 6 months (01.23.2024). Then, a follow-up appointment will be needed to re-evaluate the patient's psychological status.   I see no significant psychological factors that would hinder the success of bariatric surgery. I support Akhil "Brett Canales" Tallerico's desire for Bariatric Surgery.   Bynum Bellows, LCSW     Referrals to Alternative Service(s): Referred to Alternative Service(s):   Place:   Date:   Time:    Referred to Alternative Service(s):   Place:   Date:   Time:    Referred to Alternative Service(s):   Place:   Date:   Time:    Referred to Alternative Service(s):   Place:   Date:   Time:      Collaboration of Care: Other provider involved in patient's care [facility]  Patient/Guardian was advised Release of Information must be obtained prior to any record release in order to collaborate their care with an outside provider. Patient/Guardian was advised if they have not already done so to contact the registration department to sign all necessary forms in order for [place] to release information regarding their care.   Consent: Patient/Guardian gives verbal consent for treatment and assignment of benefits for services provided during this visit. Patient/Guardian expressed understanding and agreed to proceed.   Bynum Bellows, LCSW

## 2023-03-02 ENCOUNTER — Encounter: Payer: Self-pay | Admitting: Dietician

## 2023-03-02 ENCOUNTER — Encounter: Payer: Medicare (Managed Care) | Attending: General Surgery | Admitting: Dietician

## 2023-03-02 VITALS — Ht 71.0 in | Wt 290.3 lb

## 2023-03-02 DIAGNOSIS — Z713 Dietary counseling and surveillance: Secondary | ICD-10-CM | POA: Diagnosis not present

## 2023-03-02 DIAGNOSIS — E669 Obesity, unspecified: Secondary | ICD-10-CM | POA: Diagnosis present

## 2023-03-02 NOTE — Progress Notes (Signed)
Supervised Weight Loss Visit Bariatric Nutrition Education Appt Start Time: 10:46    End Time: 11:06  4 out of 6 SWL Appointments   Planned surgery: RYGB Pt expectation of surgery: lose about 50-60 lbs  NUTRITION ASSESSMENT  Anthropometrics  Start weight at [facility]: 299.0 lbs (date: 11/16/2022)  Height: 71 in Weight today: 290.3 lb BMI:  kg/m2     Clinical   Pharmacotherapy: History of weight loss medication used: none  Medical hx: hyperlipidemia, sleep apnea, HTN Medications: atorvastatin, metformin, losartan, jardiance, metoprolol succinate  Labs: A1c 7.8; glucose 146 Notable signs/symptoms: none noted Any previous deficiencies? No  Lifestyle & Dietary Hx  Pt brought in an empty bottle for flavored water, asking if it is a good option. Dietitian advised it is a good option. Pt states he used to drink a lot of orange and apple juice. Pt states he is doing well slowing down and chewing well. Pt states he is practicing not drinking with meals and snacks. Pt states he has been able to recognize the feeling of satisfaction before fullness.  Estimated daily fluid intake: 64 oz Supplements: n/a Current average weekly physical activity: walking 2-3 times a week for 5-30 minutes  24-Hr Dietary Recall First Meal: protein bar or yogurt or egg, grits, sausage (small portions) Snack:  Second Meal: salad or chicken tenders or vegetable plate or meat and vegetables or protein bar Snack:   Third Meal: meat and vegetables, grilled chicken or salmon at home or vegetable plate Snack: maybe small portion controlled snack bags or nabs Beverages: water, lemonade, small soda can with zero sugar, tea (half sweet half un-sweet), vitamin water zero, mini can soda  Estimated Energy Needs Calories: 2000   NUTRITION DIAGNOSIS  Overweight/obesity ([place]-3.3) related to past poor dietary habits and physical inactivity as evidenced by patient w/ planned RYGB surgery following dietary guidelines for  continued weight loss.  NUTRITION INTERVENTION  Nutrition counseling (C-1) and education (E-2) to facilitate bariatric surgery goals.  Encouraged patient to honor their body's internal hunger and fullness cues.  Throughout the day, check in mentally and rate hunger. Stop eating when satisfied not full regardless of how much food is left on the plate.  Get more if still hungry 20-30 minutes later.  The key is to honor satisfaction so throughout the meal, rate fullness factor and stop when comfortably satisfied not physically full. The key is to honor hunger and fullness without any feelings of guilt or shame.  Pay attention to what the internal cues are, rather than any external factors. This will enhance the confidence you have in listening to your own body and following those internal cues enabling you to increase how often you eat when you are hungry not out of appetite and stop when you are satisfied not full.  Encouraged pt to continue to eat balanced meals inclusive of non starchy vegetables 2 times a day 7 days a week Encouraged pt to choose lean protein sources: limiting beef, pork, sausage, hotdogs, and lunch meat Encourage pt to choose healthy fats such as plant based limiting animal fats Encouraged pt to continue to drink a minium 64 fluid ounces with half being plain water to satisfy proper hydration. Why you need complex carbohydrates: Whole grains and other complex carbohydrates are required to have a healthy diet. Whole grains provide fiber which can help with blood glucose levels and help keep you satiated. Fruits and starchy vegetables provide essential vitamins and minerals required for immune function, eyesight support, brain support, bone  density, wound healing and many other functions within the body. According to the current evidenced based 2020-2025 Dietary Guidelines for Americans, complex carbohydrates are part of a healthy eating pattern which is associated with a decreased risk  for type 2 diabetes, cancers, and cardiovascular disease.  Physical Activity: Aim for 150 minutes of physical activity weekly. Regular physical activity promotes overall health-including helping to reduce risk for heart disease and diabetes, promoting mental health, and helping [place] sleep better.   Pre-Op Goals Progress & New Goals Eat every 3-5 hours; maintain consistent routine for meals and snacks Practice not drinking with meals or snacks; wait 15 minutes before and 30 minutes after Take 20-30 minutes to eat; aim for feeling of satisfaction vs fullness Continue: track protein; aim for minimum of 80 grams per day. Continue: avoid sweetened beverages; 0-5 calories, no sugar; no juice New: increase physical activity; 4 days a week, aim for 30 minutes each time.  Handouts Provided Include  Health Benefits of Physical Activity  Learning Style & Readiness for Change Teaching method utilized: Visual & Auditory  Demonstrated degree of understanding via: Teach Back  Readiness Level: preparation Barriers to learning/adherence to lifestyle change: mobility, bad knee  RD's Notes for next Visit  Patient progress toward chosen goals.  MONITORING & EVALUATION Dietary intake, weekly physical activity, body weight, and pre-op goals in 1 month.   Next Steps  Patient is to return to [facility] in 1 month for next SWL visit.

## 2023-03-05 ENCOUNTER — Ambulatory Visit: Payer: PRIVATE HEALTH INSURANCE | Admitting: Dietician

## 2023-03-30 ENCOUNTER — Encounter: Payer: Medicare (Managed Care) | Attending: General Surgery | Admitting: Dietician

## 2023-03-30 ENCOUNTER — Encounter: Payer: Self-pay | Admitting: Dietician

## 2023-03-30 VITALS — Ht 71.0 in | Wt 286.5 lb

## 2023-03-30 DIAGNOSIS — E669 Obesity, unspecified: Secondary | ICD-10-CM | POA: Insufficient documentation

## 2023-03-30 DIAGNOSIS — Z713 Dietary counseling and surveillance: Secondary | ICD-10-CM | POA: Diagnosis not present

## 2023-03-30 DIAGNOSIS — Z6839 Body mass index (BMI) 39.0-39.9, adult: Secondary | ICD-10-CM | POA: Diagnosis not present

## 2023-03-30 NOTE — Progress Notes (Signed)
Supervised Weight Loss Visit Bariatric Nutrition Education Appt Start Time: 11:56    End Time: 12:26  5 out of 6 SWL Appointments   Planned surgery: RYGB Pt expectation of surgery: lose about 50-60 lbs  NUTRITION ASSESSMENT  Anthropometrics  Start weight at [facility]: 299.0 lbs (date: 11/16/2022)  Height: 71 in Weight today: 286.5 lb BMI: 39.96 kg/m2     Clinical   Pharmacotherapy: History of weight loss medication used: none  Medical hx: hyperlipidemia, sleep apnea, HTN Medications: atorvastatin, metformin, losartan, jardiance, metoprolol succinate  Labs: A1c 7.8; glucose 146 Notable signs/symptoms: none noted Any previous deficiencies? No  Lifestyle & Dietary Hx  Pt states he is separating his fluids from meals and snacks. Pt states he got to the gym on the exercise bike (hand pedals); in the pool. Pt states he is trying greek yogurt. Pt states he is getting 80 grams of protein. Pt states he is just trying to keep himself busy to avoid being idle.  Estimated daily fluid intake: 64 oz Supplements: n/a Current average weekly physical activity: walking 2-3 times a week for 5-30 minutes  24-Hr Dietary Recall First Meal: protein bar or yogurt or egg, grits, sausage (small portions) Snack:  Second Meal: salad or chicken tenders or vegetable plate or meat and vegetables or protein bar Snack:   Third Meal: meat and vegetables, grilled chicken or salmon at home or vegetable plate Snack: maybe small portion controlled snack bags or nabs Beverages: water, lemonade, small soda can with zero sugar, tea (half sweet half un-sweet), vitamin water zero, mini can soda  Estimated Energy Needs Calories: 2000  NUTRITION DIAGNOSIS  Overweight/obesity ([place]-3.3) related to past poor dietary habits and physical inactivity as evidenced by patient w/ planned RYGB surgery following dietary guidelines for continued weight loss.  NUTRITION INTERVENTION  Nutrition counseling (C-1) and education  (E-2) to facilitate bariatric surgery goals.  Encouraged patient to honor their body's internal hunger and fullness cues.  Throughout the day, check in mentally and rate hunger. Stop eating when satisfied not full regardless of how much food is left on the plate.  Get more if still hungry 20-30 minutes later.  The key is to honor satisfaction so throughout the meal, rate fullness factor and stop when comfortably satisfied not physically full. The key is to honor hunger and fullness without any feelings of guilt or shame.  Pay attention to what the internal cues are, rather than any external factors. This will enhance the confidence you have in listening to your own body and following those internal cues enabling you to increase how often you eat when you are hungry not out of appetite and stop when you are satisfied not full.  Encouraged pt to continue to eat balanced meals inclusive of non starchy vegetables 2 times a day 7 days a week Encouraged pt to choose lean protein sources: limiting beef, pork, sausage, hotdogs, and lunch meat Encourage pt to choose healthy fats such as plant based limiting animal fats Encouraged pt to continue to drink a minium 64 fluid ounces with half being plain water to satisfy proper hydration. Why you need complex carbohydrates: Whole grains and other complex carbohydrates are required to have a healthy diet. Whole grains provide fiber which can help with blood glucose levels and help keep you satiated. Fruits and starchy vegetables provide essential vitamins and minerals required for immune function, eyesight support, brain support, bone density, wound healing and many other functions within the body. According to the current evidenced based  2020-2025 Dietary Guidelines for Americans, complex carbohydrates are part of a healthy eating pattern which is associated with a decreased risk for type 2 diabetes, cancers, and cardiovascular disease.  Physical Activity: Aim for 150  minutes of physical activity weekly. Regular physical activity promotes overall health-including helping to reduce risk for heart disease and diabetes, promoting mental health, and helping [place] sleep better.   Pre-Op Goals Progress & New Goals Eat every 3-5 hours; maintain consistent routine for meals and snacks Practice not drinking with meals or snacks; wait 15 minutes before and 30 minutes after Take 20-30 minutes to eat; aim for feeling of satisfaction vs fullness Continue: track protein; aim for minimum of 80 grams per day. Re-engage: avoid sugar sweetened beverages; 0-5 calories, no sugar; no juice Continue: increase physical activity; 4 days a week, aim for 30 minutes each time.  Handouts Provided Include    Learning Style & Readiness for Change Teaching method utilized: Visual & Auditory  Demonstrated degree of understanding via: Teach Back  Readiness Level: preparation Barriers to learning/adherence to lifestyle change: mobility, bad knee  RD's Notes for next Visit  Patient progress toward chosen goals.  MONITORING & EVALUATION Dietary intake, weekly physical activity, body weight, and pre-op goals in 1 month.   Next Steps  Patient is to return to [facility] in 1 month for next SWL visit.

## 2023-04-29 ENCOUNTER — Ambulatory Visit: Payer: Medicare (Managed Care) | Admitting: Dietician

## 2023-05-03 ENCOUNTER — Encounter: Payer: Medicare (Managed Care) | Attending: General Surgery | Admitting: Dietician

## 2023-05-03 ENCOUNTER — Encounter: Payer: Self-pay | Admitting: Dietician

## 2023-05-03 VITALS — Ht 71.0 in | Wt 285.1 lb

## 2023-05-03 DIAGNOSIS — E669 Obesity, unspecified: Secondary | ICD-10-CM | POA: Diagnosis present

## 2023-05-03 DIAGNOSIS — Z713 Dietary counseling and surveillance: Secondary | ICD-10-CM | POA: Diagnosis not present

## 2023-05-03 DIAGNOSIS — Z6839 Body mass index (BMI) 39.0-39.9, adult: Secondary | ICD-10-CM | POA: Diagnosis not present

## 2023-05-03 NOTE — Progress Notes (Signed)
Supervised Weight Loss Visit Bariatric Nutrition Education Appt Start Time: 12:10   End Time: 12:39  6 out of 6 SWL Appointments   Pt completed visits.   Pt has cleared nutrition requirements.   Planned surgery: RYGB Pt expectation of surgery: lose about 50-60 lbs  NUTRITION ASSESSMENT  Anthropometrics  Start weight at [facility]: 299.0 lbs (date: 11/16/2022)  Height: 71 in Weight today: 285.1 lb BMI: 39.76 kg/m2     Clinical   Pharmacotherapy: History of weight loss medication used: none  Medical hx: hyperlipidemia, sleep apnea, HTN Medications: atorvastatin, metformin, losartan, jardiance, metoprolol succinate  Labs: A1c 7.8; glucose 146 Notable signs/symptoms: none noted Any previous deficiencies? No  Lifestyle & Dietary Hx  Pt states the chewing and slowing down is becoming a habit, stating as well as getting enough protein and increasing physical activity. Pt states he is sharing a meal with his wife, stating he is feeling satisfied as much as eating a full meal. Pt states he is doing well being more mindful of all the goals. Pt states he will work on getting rid of carbonation in diet sodas, stating he will drink a whole lot of water.  Estimated daily fluid intake: 64 oz Supplements: n/a Current average weekly physical activity: walking 2-3 times a week for 5-30 minutes; machine using only arms or legs too.  24-Hr Dietary Recall First Meal: protein bar or yogurt or egg, grits, sausage (small portions) Snack:  Second Meal: salad or chicken tenders or vegetable plate or meat and vegetables or protein bar Snack:   Third Meal: meat and vegetables, grilled chicken or salmon at home or vegetable plate Snack: maybe small portion controlled snack bags or nabs Beverages: water, lemonade, small soda can with zero sugar, tea (half sweet half un-sweet), vitamin water zero, mini can soda  Estimated Energy Needs Calories: 2000  NUTRITION DIAGNOSIS  Overweight/obesity  ([place]-3.3) related to past poor dietary habits and physical inactivity as evidenced by patient w/ planned RYGB surgery following dietary guidelines for continued weight loss.  NUTRITION INTERVENTION  Nutrition counseling (C-1) and education (E-2) to facilitate bariatric surgery goals.  Encouraged patient to honor their body's internal hunger and fullness cues.  Throughout the day, check in mentally and rate hunger. Stop eating when satisfied not full regardless of how much food is left on the plate.  Get more if still hungry 20-30 minutes later.  The key is to honor satisfaction so throughout the meal, rate fullness factor and stop when comfortably satisfied not physically full. The key is to honor hunger and fullness without any feelings of guilt or shame.  Pay attention to what the internal cues are, rather than any external factors. This will enhance the confidence you have in listening to your own body and following those internal cues enabling you to increase how often you eat when you are hungry not out of appetite and stop when you are satisfied not full.  Encouraged pt to continue to eat balanced meals inclusive of non starchy vegetables 2 times a day 7 days a week Encouraged pt to choose lean protein sources: limiting beef, pork, sausage, hotdogs, and lunch meat Encourage pt to choose healthy fats such as plant based limiting animal fats Encouraged pt to continue to drink a minium 64 fluid ounces with half being plain water to satisfy proper hydration. Physical Activity: Aim for 150 minutes of physical activity weekly. Regular physical activity promotes overall health-including helping to reduce risk for heart disease and diabetes, promoting mental health,  and helping [place] sleep better.   Pre-Op Goals Progress & New Goals Eat every 3-5 hours; maintain consistent routine for meals and snacks Practice not drinking with meals or snacks; wait 15 minutes before and 30 minutes after Take 20-30 minutes  to eat; aim for feeling of satisfaction vs fullness Continue: track protein; aim for minimum of 80 grams per day. Continue: avoid sugar sweetened beverages; 0-5 calories, no sugar; no juice Continue: increase physical activity; 4 days a week, aim for 30 minutes each time. New: avoid carbonation; choose plain water or un-carbonated fluids with zero sugar.  Handouts Provided Include    Learning Style & Readiness for Change Teaching method utilized: Visual & Auditory  Demonstrated degree of understanding via: Teach Back  Readiness Level: preparation Barriers to learning/adherence to lifestyle change: mobility, bad knee  RD's Notes for next Visit  Patient progress toward chosen goals.  MONITORING & EVALUATION Dietary intake, weekly physical activity, body weight, and pre-op goals in 1 month.   Next Steps  Pt has completed visits. No further supervised visits required/recommended. Patient is to return to [facility] for pre-op class >3 weeks prior to scheduled surgery.

## 2023-06-10 ENCOUNTER — Ambulatory Visit: Payer: Self-pay | Admitting: General Surgery

## 2023-06-10 DIAGNOSIS — E119 Type 2 diabetes mellitus without complications: Secondary | ICD-10-CM

## 2023-06-14 ENCOUNTER — Encounter: Payer: Medicare (Managed Care) | Attending: General Surgery | Admitting: Dietician

## 2023-06-14 ENCOUNTER — Encounter: Payer: Self-pay | Admitting: Dietician

## 2023-06-14 VITALS — Ht 71.0 in | Wt 290.7 lb

## 2023-06-14 DIAGNOSIS — Z6841 Body Mass Index (BMI) 40.0 and over, adult: Secondary | ICD-10-CM | POA: Insufficient documentation

## 2023-06-14 DIAGNOSIS — E669 Obesity, unspecified: Secondary | ICD-10-CM | POA: Diagnosis present

## 2023-06-14 DIAGNOSIS — Z713 Dietary counseling and surveillance: Secondary | ICD-10-CM | POA: Diagnosis not present

## 2023-06-14 NOTE — Progress Notes (Signed)
Pre-Operative Nutrition Class:    Patient was seen on 06/14/2023 for Pre-Operative Bariatric Surgery Education at [facility]    Surgery date: 06/28/2023 Surgery type: RYGB  Anthropometrics  Start weight at [facility]: 299.0 lbs (date: 11/16/2022)  Height: 71 in Weight today: 290.7 lb BMI: 40.54 kg/m2     Clinical  Medical hx: hyperlipidemia, sleep apnea, HTN Medications: atorvastatin, metformin, losartan, jardiance, metoprolol succinate  Labs: A1c 7.8; glucose 146 Notable signs/symptoms: none noted Any previous deficiencies? No  Samples given per MNT protocol. Patient educated on appropriate usage: [employer] Multivitamin Lot # [id number] Exp: 12/2024   Celebrate Vitamins Calcium  Lot # [id number] Exp: 07/2024   Ensure Max Protein Shake Lot # [id number] Exp: 04/19/2024  The following the learning objectives were met by the patient during this course: Identify Pre-Op Dietary Goals and will begin 2 weeks pre-operatively Identify appropriate sources of fluids and proteins  State protein recommendations and appropriate sources pre and post-operatively Identify Post-Operative Dietary Goals and will follow for 2 weeks post-operatively Identify appropriate multivitamin and calcium sources Describe the need for physical activity post-operatively and will follow MD recommendations State when to call healthcare provider regarding medication questions or post-operative complications When having a diagnosis of diabetes understanding hypoglycemia symptoms and the inclusion of 1 complex carbohydrate per meal  Handouts given during class include: Pre-Op Bariatric Surgery Diet Handout Protein Shake Handout Post-Op Bariatric Surgery Nutrition Handout BELT Program Information Flyer Support Group Information Flyer [facility] Bariatric Supplements Price List  Follow-Up Plan: Patient will follow-up at [facility] 2 weeks post operatively for diet advancement  per MD.

## 2023-06-22 NOTE — Patient Instructions (Addendum)
SURGICAL WAITING ROOM VISITATION Patients having surgery or a procedure may have no more than 2 support people in the waiting area - these visitors may rotate.    Children under the age of 42 must have an adult with them who is not the patient.  If the patient needs to stay at the hospital during part of their recovery, the visitor guidelines for inpatient rooms apply. Pre-op nurse will coordinate an appropriate time for 1 support person to accompany patient in pre-op.  This support person may not rotate.    Please refer to the [facility] website for the visitor guidelines for Inpatients (after your surgery is over and you are in a regular room).       Your procedure is scheduled on: 06-28-23   Report to [facility] Main Entrance    Report to admitting at 5:15 AM   Call this number if you have problems the morning of surgery [phone]   Do not eat food :After 6:00 PM the night before surgery .   After Midnight you may have the following liquids until 4:30 AM DAY OF SURGERY  Water Non-Citrus Juices (without pulp, NO RED-Apple, White grape, White cranberry) Black Coffee (NO MILK/CREAM OR CREAMERS, sugar ok)  Clear Tea (NO MILK/CREAM OR CREAMERS, sugar ok) regular and decaf                             Plain Jell-O (NO RED)                                           Fruit ices (not with fruit pulp, NO RED)                                     Popsicles (NO RED)                                                               Sports drinks like Gatorade (NO RED)                   The day of surgery:  Drink ONE (1) Pre-Surgery G2 by 4:30 AM the morning of surgery. Drink in one sitting. Do not sip.  This drink was given to you during your hospital  pre-op appointment visit. Nothing else to drink after completing the Pre-Surgery G2.          If you have questions, please contact your surgeon's office.   FOLLOW  ANY ADDITIONAL PRE OP INSTRUCTIONS YOU RECEIVED FROM YOUR  SURGEON'S OFFICE!!!     Oral Hygiene is also important to reduce your risk of infection.                                    Remember - BRUSH YOUR TEETH THE MORNING OF SURGERY WITH YOUR REGULAR TOOTHPASTE   Do NOT smoke after Midnight   Take these medicines the morning of surgery with A SIP OF WATER:   Atorvastatin  Metoprolol  Stop all vitamins and herbal supplements 7 days before surgery  How to Manage Your Diabetes Before and After Surgery  Why is it important to control my blood sugar before and after surgery? Improving blood sugar levels before and after surgery helps healing and can limit problems. A way of improving blood sugar control is eating a healthy diet by:  Eating less sugar and carbohydrates  Increasing activity/exercise  Talking with your doctor about reaching your blood sugar goals High blood sugars (greater than 180 mg/dL) can raise your risk of infections and slow your recovery, so you will need to focus on controlling your diabetes during the weeks before surgery. Make sure that the doctor who takes care of your diabetes knows about your planned surgery including the date and location.  How do I manage my blood sugar before surgery? Check your blood sugar at least 4 times a day, starting 2 days before surgery, to make sure that the level is not too high or low. Check your blood sugar the morning of your surgery when you wake up and every 2 hours until you get to the Short Stay unit. If your blood sugar is less than 70 mg/dL, you will need to treat for low blood sugar: Do not take insulin. Treat a low blood sugar (less than 70 mg/dL) with  cup of clear juice (cranberry or apple), 4 glucose tablets, OR glucose gel. Recheck blood sugar in 15 minutes after treatment (to make sure it is greater than 70 mg/dL). If your blood sugar is not greater than 70 mg/dL on recheck, call [phone] for further instructions. Report your blood sugar to the short stay nurse when  you get to Short Stay.  If you are admitted to the hospital after surgery: Your blood sugar will be checked by the staff and you will probably be given insulin after surgery (instead of oral diabetes medicines) to make sure you have good blood sugar levels. The goal for blood sugar control after surgery is 80-180 mg/dL.   WHAT DO I DO ABOUT MY DIABETES MEDICATION?  Do not take oral diabetes medicines (pills) the morning of surgery.  Hold Jardiance 3 days before surgery (do not take after 06-23-23)  DO NOT TAKE THE FOLLOWING 7 DAYS PRIOR TO SURGERY: Ozempic, Wegovy, Rybelsus (Semaglutide), Byetta (exenatide), Bydureon (exenatide ER), Victoza, Saxenda (liraglutide), or Trulicity (dulaglutide) Mounjaro (Tirzepatide) Adlyxin (Lixisenatide), Polyethylene Glycol Loxenatide.  Reviewed and Endorsed by [facility] Patient Education Committee, August 2015  Bring CPAP mask and tubing day of surgery.                              You may not have any metal on your body including jewelry, and body piercing             Do not wear  lotions, powders, cologne, or deodorant              Men may shave face and neck.   Do not bring valuables to the hospital. [facility] IS NOT RESPONSIBLE   FOR VALUABLES.   Contacts, dentures or bridgework may not be worn into surgery.   Bring small overnight bag day of surgery.   DO NOT BRING YOUR HOME MEDICATIONS TO THE HOSPITAL. PHARMACY WILL DISPENSE MEDICATIONS LISTED ON YOUR MEDICATION LIST TO YOU DURING YOUR ADMISSION IN THE HOSPITAL!   Special Instructions: Bring a copy of your healthcare power of attorney and living  will documents the day of surgery if you haven't scanned them before.              Please read over the following fact sheets you were given: IF YOU HAVE QUESTIONS ABOUT YOUR PRE-OP INSTRUCTIONS PLEASE CALL [phone] [facility]  If you received a COVID test during your pre-op visit  it is requested that you wear a mask when out in public, stay away  from anyone that may not be feeling well and notify your surgeon if you develop symptoms. If you test positive for Covid or have been in contact with anyone that has tested positive in the last 10 days please notify you surgeon.  [place] - Preparing for Surgery Before surgery, you can play an important role.  Because skin is not sterile, your skin needs to be as free of germs as possible.  You can reduce the number of germs on your skin by washing with CHG (chlorahexidine gluconate) soap before surgery.  CHG is an antiseptic cleaner which kills germs and bonds with the skin to continue killing germs even after washing. Please DO NOT use if you have an allergy to CHG or antibacterial soaps.  If your skin becomes reddened/irritated stop using the CHG and inform your nurse when you arrive at Short Stay. Do not shave (including legs and underarms) for at least 48 hours prior to the first CHG shower.  You may shave your face/neck.  Please follow these instructions carefully:  1.  Shower with CHG Soap the night before surgery and the  morning of surgery.  2.  If you choose to wash your hair, wash your hair first as usual with your normal  shampoo.  3.  After you shampoo, rinse your hair and body thoroughly to remove the shampoo.                             4.  Use CHG as you would any other liquid soap.  You can apply chg directly to the skin and wash.  Gently with a scrungie or clean washcloth.  5.  Apply the CHG Soap to your body ONLY FROM THE NECK DOWN.   Do   not use on face/ open                           Wound or open sores. Avoid contact with eyes, ears mouth and   genitals (private parts).                       Wash face,  Genitals (private parts) with your normal soap.             6.  Wash thoroughly, paying special attention to the area where your    surgery  will be performed.  7.  Thoroughly rinse your body with warm water from the neck down.  8.  DO NOT shower/wash with your normal soap  after using and rinsing off the CHG Soap.                9.  Pat yourself dry with a clean towel.            10.  Wear clean pajamas.            11.  Place clean sheets on your bed the night of your first shower and do not  sleep with  pets. Day of Surgery : Do not apply any lotions/deodorants the morning of surgery.  Please wear clean clothes to the hospital/surgery center.  FAILURE TO FOLLOW THESE INSTRUCTIONS MAY RESULT IN THE CANCELLATION OF YOUR SURGERY  PATIENT SIGNATURE_________________________________  NURSE SIGNATURE__________________________________  ________________________________________________________________________    Rogelia Mire  An incentive spirometer is a tool that can help keep your lungs clear and active. This tool measures how well you are filling your lungs with each breath. Taking long deep breaths may help reverse or decrease the chance of developing breathing (pulmonary) problems (especially infection) following: A long period of time when you are unable to move or be active. BEFORE THE PROCEDURE  If the spirometer includes an indicator to show your best effort, your nurse or respiratory therapist will set it to a desired goal. If possible, sit up straight or lean slightly forward. Try not to slouch. Hold the incentive spirometer in an upright position. INSTRUCTIONS FOR USE  Sit on the edge of your bed if possible, or sit up as far as you can in bed or on a chair. Hold the incentive spirometer in an upright position. Breathe out normally. Place the mouthpiece in your mouth and seal your lips tightly around it. Breathe in slowly and as deeply as possible, raising the piston or the ball toward the top of the column. Hold your breath for 3-5 seconds or for as long as possible. Allow the piston or ball to fall to the bottom of the column. Remove the mouthpiece from your mouth and breathe out normally. Rest for a few seconds and repeat Steps 1 through 7  at least 10 times every 1-2 hours when you are awake. Take your time and take a few normal breaths between deep breaths. The spirometer may include an indicator to show your best effort. Use the indicator as a goal to work toward during each repetition. After each set of 10 deep breaths, practice coughing to be sure your lungs are clear. If you have an incision (the cut made at the time of surgery), support your incision when coughing by placing a pillow or rolled up towels firmly against it. Once you are able to get out of bed, walk around indoors and cough well. You may stop using the incentive spirometer when instructed by your caregiver.  RISKS AND COMPLICATIONS Take your time so you do not get dizzy or light-headed. If you are in pain, you may need to take or ask for pain medication before doing incentive spirometry. It is harder to take a deep breath if you are having pain. AFTER USE Rest and breathe slowly and easily. It can be helpful to keep track of a log of your progress. Your caregiver can provide you with a simple table to help with this. If you are using the spirometer at home, follow these instructions: SEEK MEDICAL CARE IF:  You are having difficultly using the spirometer. You have trouble using the spirometer as often as instructed. Your pain medication is not giving enough relief while using the spirometer. You develop fever of 100.5 F (38.1 C) or higher. SEEK IMMEDIATE MEDICAL CARE IF:  You cough up bloody sputum that had not been present before. You develop fever of 102 F (38.9 C) or greater. You develop worsening pain at or near the incision site. MAKE SURE YOU:  Understand these instructions. Will watch your condition. Will get help right away if you are not doing well or get worse. Document Released: 11/16/2006 Document Revised: 09/28/2011  Document Reviewed: 01/17/2007 [facility] Patient Information 2014 [employer],  [place].   ________________________________________________________________________ WHAT IS A BLOOD TRANSFUSION? Blood Transfusion Information  A transfusion is the replacement of blood or some of its parts. Blood is made up of multiple cells which provide different functions. Red blood cells carry oxygen and are used for blood loss replacement. White blood cells fight against infection. Platelets control bleeding. Plasma helps clot blood. Other blood products are available for specialized needs, such as hemophilia or other clotting disorders. BEFORE THE TRANSFUSION  Who gives blood for transfusions?  Healthy volunteers who are fully evaluated to make sure their blood is safe. This is blood bank blood. Transfusion therapy is the safest it has ever been in the practice of medicine. Before blood is taken from a donor, a complete history is taken to make sure that person has no history of diseases nor engages in risky social behavior (examples are intravenous drug use or sexual activity with multiple partners). The donor's travel history is screened to minimize risk of transmitting infections, such as malaria. The donated blood is tested for signs of infectious diseases, such as HIV and hepatitis. The blood is then tested to be sure it is compatible with you in order to minimize the chance of a transfusion reaction. If you or a relative donates blood, this is often done in anticipation of surgery and is not appropriate for emergency situations. It takes many days to process the donated blood. RISKS AND COMPLICATIONS Although transfusion therapy is very safe and saves many lives, the main dangers of transfusion include:  Getting an infectious disease. Developing a transfusion reaction. This is an allergic reaction to something in the blood you were given. Every precaution is taken to prevent this. The decision to have a blood transfusion has been considered carefully by your caregiver before blood is  given. Blood is not given unless the benefits outweigh the risks. AFTER THE TRANSFUSION Right after receiving a blood transfusion, you will usually feel much better and more energetic. This is especially true if your red blood cells have gotten low (anemic). The transfusion raises the level of the red blood cells which carry oxygen, and this usually causes an energy increase. The nurse administering the transfusion will monitor you carefully for complications. HOME CARE INSTRUCTIONS  No special instructions are needed after a transfusion. You may find your energy is better. Speak with your caregiver about any limitations on activity for underlying diseases you may have. SEEK MEDICAL CARE IF:  Your condition is not improving after your transfusion. You develop redness or irritation at the intravenous (IV) site. SEEK IMMEDIATE MEDICAL CARE IF:  Any of the following symptoms occur over the next 12 hours: Shaking chills. You have a temperature by mouth above 102 F (38.9 C), not controlled by medicine. Chest, back, or muscle pain. People around you feel you are not acting correctly or are confused. Shortness of breath or difficulty breathing. Dizziness and fainting. You get a rash or develop hives. You have a decrease in urine output. Your urine turns a dark color or changes to pink, red, or brown. Any of the following symptoms occur over the next 10 days: You have a temperature by mouth above 102 F (38.9 C), not controlled by medicine. Shortness of breath. Weakness after normal activity. The white part of the eye turns yellow (jaundice). You have a decrease in the amount of urine or are urinating less often. Your urine turns a dark color or changes to pink, red,  or brown. Document Released: 07/03/2000 Document Revised: 09/28/2011 Document Reviewed: 02/20/2008 [facility] Patient Information 2014 [place], [place].  _______________________________________________________________________

## 2023-06-22 NOTE — Progress Notes (Signed)
COVID Vaccine Completed:  Yes  Date of COVID positive in last 90 days:  PCP - Simmie Davies, MD Cardiologist - Lear Ng, MD/Boonpon Thongteum, NP  Chest x-ray - 02-01-23 Epic EKG - 01-15-23 [facility] Stress Test - 12-22-21 [facility] ECHO - 12-22-21 [facility] Cardiac Cath -  Pacemaker/ICD device last checked: Spinal Cord Stimulator: Electrical cardioversion - 06-25-15 [facility]  Bowel Prep -   Sleep Study - Yes, +sleep apnea CPAP -   Fasting Blood Sugar -  Checks Blood Sugar _____ times a day  Last dose of GLP1 agonist-  N/A GLP1 instructions:  Hold 7 days before surgery    Jardiance  Last dose of SGLT-2 inhibitors- 06-23-23 SGLT-2 instructions:  Hold 3 days before surgery    Blood Thinner Instructions:  Time Aspirin Instructions: Last Dose:  Activity level:  Can go up a flight of stairs and perform activities of daily living without stopping and without symptoms of chest pain or shortness of breath.  Able to exercise without symptoms  Unable to go up a flight of stairs without symptoms of     Anesthesia review:  AVNRT S/p ablation, hx of dyspnea, HTN, DM, OSA  Patient denies shortness of breath, fever, cough and chest pain at PAT appointment  Patient verbalized understanding of instructions that were given to them at the PAT appointment. Patient was also instructed that they will need to review over the PAT instructions again at home before surgery.

## 2023-06-23 ENCOUNTER — Other Ambulatory Visit: Payer: Self-pay

## 2023-06-23 ENCOUNTER — Encounter (HOSPITAL_COMMUNITY)
Admission: RE | Admit: 2023-06-23 | Discharge: 2023-06-23 | Disposition: A | Discharge status: Home / Self Care | Payer: Medicare (Managed Care) | Source: Ambulatory Visit | Attending: General Surgery | Admitting: General Surgery

## 2023-06-23 ENCOUNTER — Encounter (HOSPITAL_COMMUNITY): Payer: Self-pay

## 2023-06-23 VITALS — BP 149/102 | HR 62 | Temp 98.0°F | Resp 20 | Ht 71.0 in | Wt 282.2 lb

## 2023-06-23 DIAGNOSIS — Z01812 Encounter for preprocedural laboratory examination: Secondary | ICD-10-CM | POA: Diagnosis present

## 2023-06-23 DIAGNOSIS — Z6839 Body mass index (BMI) 39.0-39.9, adult: Secondary | ICD-10-CM | POA: Insufficient documentation

## 2023-06-23 DIAGNOSIS — E785 Hyperlipidemia, unspecified: Secondary | ICD-10-CM | POA: Insufficient documentation

## 2023-06-23 DIAGNOSIS — G4733 Obstructive sleep apnea (adult) (pediatric): Secondary | ICD-10-CM | POA: Diagnosis not present

## 2023-06-23 DIAGNOSIS — I10 Essential (primary) hypertension: Secondary | ICD-10-CM | POA: Diagnosis not present

## 2023-06-23 DIAGNOSIS — E119 Type 2 diabetes mellitus without complications: Secondary | ICD-10-CM | POA: Diagnosis not present

## 2023-06-23 HISTORY — DX: Family history of other specified conditions: Z84.89

## 2023-06-23 HISTORY — DX: Unspecified osteoarthritis, unspecified site: M19.90

## 2023-06-23 HISTORY — DX: Type 2 diabetes mellitus without complications: E11.9

## 2023-06-23 HISTORY — DX: Other specified postprocedural states: Z98.890

## 2023-06-23 LAB — COMPREHENSIVE METABOLIC PANEL
ALT: 23 U/L (ref 0–44)
AST: 21 U/L (ref 15–41)
Albumin: 3.9 g/dL (ref 3.5–5.0)
Alkaline Phosphatase: 54 U/L (ref 38–126)
Anion gap: 11 (ref 5–15)
BUN: 11 mg/dL (ref 8–23)
CO2: 24 mmol/L (ref 22–32)
Calcium: 9.3 mg/dL (ref 8.9–10.3)
Chloride: 106 mmol/L (ref 98–111)
Creatinine, Ser: 0.84 mg/dL (ref 0.61–1.24)
GFR, Estimated: >60 mL/min (ref >60)
Glucose, Bld: 123 mg/dL — ABNORMAL HIGH (ref 70–99)
Potassium: 3.3 mmol/L — ABNORMAL LOW (ref 3.5–5.1)
Sodium: 141 mmol/L (ref 135–145)
Total Bilirubin: 1.2 mg/dL — ABNORMAL HIGH (ref <1.2)
Total Protein: 7.4 g/dL (ref 6.5–8.1)

## 2023-06-23 LAB — CBC WITH DIFFERENTIAL/PLATELET
Abs Immature Granulocytes: 0 10*3/uL (ref 0.00–0.07)
Basophils Absolute: 0.1 10*3/uL (ref 0.0–0.1)
Basophils Relative: 2 %
Eosinophils Absolute: 0.2 10*3/uL (ref 0.0–0.5)
Eosinophils Relative: 7 %
HCT: 52.1 % — ABNORMAL HIGH (ref 39.0–52.0)
Hemoglobin: 16 g/dL (ref 13.0–17.0)
Immature Granulocytes: 0 %
Lymphocytes Relative: 36 %
Lymphs Abs: 1.3 10*3/uL (ref 0.7–4.0)
MCH: 29 pg (ref 26.0–34.0)
MCHC: 30.7 g/dL (ref 30.0–36.0)
MCV: 94.4 fL (ref 80.0–100.0)
Monocytes Absolute: 0.4 10*3/uL (ref 0.1–1.0)
Monocytes Relative: 11 %
Neutro Abs: 1.7 10*3/uL (ref 1.7–7.7)
Neutrophils Relative %: 44 %
Platelets: 220 10*3/uL (ref 150–400)
RBC: 5.52 MIL/uL (ref 4.22–5.81)
RDW: 14 % (ref 11.5–15.5)
WBC: 3.7 10*3/uL — ABNORMAL LOW (ref 4.0–10.5)
nRBC: 0 % (ref 0.0–0.2)

## 2023-06-23 LAB — HEMOGLOBIN A1C
Hgb A1c MFr Bld: 6.7 % — ABNORMAL HIGH (ref 4.8–5.6)
Mean Plasma Glucose: 145.59 mg/dL

## 2023-06-23 LAB — GLUCOSE, CAPILLARY: Glucose-Capillary: 127 mg/dL — ABNORMAL HIGH (ref 70–99)

## 2023-06-24 NOTE — Discharge Instructions (Signed)

## 2023-06-24 NOTE — Progress Notes (Signed)
Case: [id number] Date/Time: 06/28/23 0715   Procedure: LAPAROSCOPIC ROUX-EN-Y GASTRIC BYPASS WITH UPPER ENDOSCOPY   Anesthesia type: General   Pre-op diagnosis: MORBID OBESITY   Location: [facility] ROOM 05 / [facility]   Surgeons: Gaynelle Adu, MD       DISCUSSION: Colton Powell is a 62 yo male who presents to PAT prior to surgery above. PMH of HTN, HLD, AVNRT s/p ablation, OSA (wears CPAP), DM, arthritis, obesity (BMI 39).  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 Last seen on 01/15/23. He has been doing well from cardiac standpoint and has not had any symptoms. Advised to continue current medication regimen. Cleared by cardiology:   "Plan: At this time Gurfateh Fagerberg appears to be in no distress. He will continue his medications. He is doing well from a cardiac standpoint without any complaints. He can proceed with his surgery as scheduled. Mr. Dian Situ will follow-up in 1 year or sooner if necessary."  Last seen by PCP on 04/06/23. All issues appear stable.  VS: BP (!) 149/102   Pulse 62   Temp 36.7 C (Oral)   Resp 20   Ht 5\' 11"  (1.803 m)   Wt 128 kg   SpO2 100%   BMI 39.36 kg/m   PROVIDERS: Stugart, Valda Favia, MD Cardiologist - Lear Ng, MD/Boonpon Thongteum, NP  LABS: Labs reviewed: Acceptable for surgery. Has intermittent chronic leukopenia (all labs ordered are listed, but only abnormal results are displayed)  Labs Reviewed  CBC WITH DIFFERENTIAL/PLATELET - Abnormal; Notable for the following components:      Result Value   WBC 3.7 (*)    HCT 52.1 (*)    All other components within normal limits  COMPREHENSIVE METABOLIC PANEL - Abnormal; Notable for the following components:   Potassium 3.3 (*)    Glucose, Bld 123 (*)    Total Bilirubin 1.2 (*)    All other components within normal limits  HEMOGLOBIN A1C - Abnormal; Notable for the following components:   Hgb A1c MFr Bld 6.7  (*)    All other components within normal limits  GLUCOSE, CAPILLARY - Abnormal; Notable for the following components:   Glucose-Capillary 127 (*)    All other components within normal limits  TYPE AND SCREEN     IMAGES:   EKG:   CV:  Echo 12/22/2021:  Impression Left Ventricle: Systolic function is normal. EF: 55-60%.   Left Ventricle: There is mild hypertrophy.   Right Ventricle: Right ventricle size is normal.   Right Ventricle: Systolic function is normal.   Mitral Valve: There is trace regurgitation.  There are no significant valve abnormalities noted.  NM Stress test 12/22/2021:   Impression:  No evidence of inducible ischemia. Low normal left ventricular function (subjectively EF looks okay would confirm with echo for more accurate EF assessment)  Fair exercise tolerance (5 minutes), 7.2 METS.  Short burst of WCT (10-15 beats)in early stage II which resolved prior to recovery.  No symptoms with WCT. Frequent PVCs at peak exercise. No ischemic ST depressions noted.  Past Medical History:  Diagnosis Date   Arthritis    Diabetes mellitus without complication (HCC)    Family history of adverse reaction to anesthesia    Mom has confusion and N&V   High cholesterol    Takes atorvastatin   Hypertension    PONV (postoperative nausea and vomiting)    Sleep apnea  CPAP    Past Surgical History:  Procedure Laterality Date   BACK SURGERY  07/20/2010   CARDIAC CATHETERIZATION     09/07/11 [facility]): Near normal coronaries, LVEF 65%   COLONOSCOPY     HERNIA REPAIR  10/18/2013   KNEE ARTHROSCOPY Bilateral 07/20/2001   TOTAL KNEE ARTHROPLASTY Left 08/30/2017   Procedure: LEFT TOTAL KNEE ARTHROPLASTY;  Surgeon: Durene Romans, MD;  Location: [facility];  Service: Orthopedics;  Laterality: Left;  90 mins   UMBILICAL HERNIA REPAIR N/A 02/01/2015   Procedure: LAPAROSCOPIC ASSISTED REPAIR OF SUPRAUMBILICAL HERNIA REPAIR WITH MESH;  Surgeon: Gaynelle Adu, MD;  Location:  [facility];  Service: General;  Laterality: N/A;    MEDICATIONS:  APPLE CIDER VINEGAR PO   aspirin EC 81 MG tablet   atorvastatin (LIPITOR) 20 MG tablet   Brimonidine Tartrate (LUMIFY) 0.025 % SOLN   Calcium Carbonate (CALCIUM 500 PO)   empagliflozin (JARDIANCE) 25 MG TABS tablet   losartan-hydrochlorothiazide (HYZAAR) 100-12.5 MG tablet   metFORMIN (GLUCOPHAGE) 500 MG tablet   metoprolol succinate (TOPROL-XL) 100 MG 24 hr tablet   Multiple Vitamin (MULTIVITAMIN WITH MINERALS) TABS tablet   No current facility-administered medications for this encounter.   Marcille Blanco [facility] Short Stay/Anesthesiology [facility] Phone [phone] 06/24/2023 9:33 AM

## 2023-06-27 NOTE — Anesthesia Preprocedure Evaluation (Signed)
Anesthesia Evaluation  Patient identified by MRN, date of birth, ID band Patient awake    Reviewed: Allergy & Precautions, NPO status , Patient's Chart, lab work & pertinent test results  History of Anesthesia Complications (+) PONV, Family history of anesthesia reaction and history of anesthetic complications (mom with confusion and n/v)  Airway Mallampati: III  TM Distance: >3 FB Neck ROM: Full   Comment: Previous grade I view with MAC 4, easy mask Dental  (+) Dental Advisory Given   Pulmonary neg shortness of breath, sleep apnea and Continuous Positive Airway Pressure Ventilation , neg COPD, neg recent URI, Current Smoker and Patient abstained from smoking.   Pulmonary exam normal breath sounds clear to auscultation       Cardiovascular hypertension (losartan-HCTZ, metoprolol), Pt. on medications (-) angina (-) Past MI, (-) Cardiac Stents and (-) CABG + dysrhythmias (iRBBB, AVNRT S/p ablation)  Rhythm:Regular Rate:Normal  HLD  TTE 12/22/2021: Impression Left Ventricle: Systolic function is normal. EF: 55-60%.   Left Ventricle: There is mild hypertrophy.   Right Ventricle: Right ventricle size is normal.   Right Ventricle: Systolic function is normal.   Mitral Valve: There is trace regurgitation.  There are no significant valve abnormalities noted.    Neuro/Psych negative neurological ROS     GI/Hepatic negative GI ROS, Neg liver ROS,,,  Endo/Other  diabetes (Hgb A1c 6.7), Well Controlled, Type 2  Class 3 obesity  Renal/GU      Musculoskeletal  (+) Arthritis ,    Abdominal  (+) + obese  Peds  Hematology negative hematology ROS (+) Lab Results      Component                Value               Date                      WBC                      3.7 (L)             06/23/2023                HGB                      16.0                06/23/2023                HCT                      52.1 (H)             06/23/2023                MCV                      94.4                06/23/2023                PLT                      220                 06/23/2023              Anesthesia Other Findings  Reproductive/Obstetrics                             Anesthesia Physical Anesthesia Plan  ASA: 3  Anesthesia Plan: General   Post-op Pain Management: Tylenol PO (pre-op)*   Induction: Intravenous  PONV Risk Score and Plan: 2 and Ondansetron, Dexamethasone and Treatment may vary due to age or medical condition  Airway Management Planned: Oral ETT  Additional Equipment:   Intra-op Plan:   Post-operative Plan: Extubation in OR  Informed Consent: I have reviewed the patients History and Physical, chart, labs and discussed the procedure including the risks, benefits and alternatives for the proposed anesthesia with the patient or authorized representative who has indicated his/her understanding and acceptance.     Dental advisory given  Plan Discussed with: CRNA and Anesthesiologist  Anesthesia Plan Comments: (Risks of general anesthesia discussed including, but not limited to, sore throat, hoarse voice, chipped/damaged teeth, injury to vocal cords, nausea and vomiting, allergic reactions, lung infection, heart attack, stroke, and death. All questions answered. )       Anesthesia Quick Evaluation

## 2023-06-28 ENCOUNTER — Other Ambulatory Visit: Payer: Self-pay

## 2023-06-28 ENCOUNTER — Inpatient Hospital Stay (HOSPITAL_COMMUNITY): Payer: Medicare (Managed Care) | Admitting: Medical

## 2023-06-28 ENCOUNTER — Encounter (HOSPITAL_COMMUNITY): Payer: Self-pay | Admitting: General Surgery

## 2023-06-28 ENCOUNTER — Inpatient Hospital Stay (HOSPITAL_COMMUNITY): Payer: Medicare (Managed Care) | Admitting: Anesthesiology

## 2023-06-28 ENCOUNTER — Inpatient Hospital Stay (HOSPITAL_COMMUNITY)
Admission: RE | Admit: 2023-06-28 | Discharge: 2023-06-29 | DRG: 620 | Disposition: A | Discharge status: Home / Self Care | Payer: Medicare (Managed Care) | Attending: General Surgery | Admitting: General Surgery

## 2023-06-28 ENCOUNTER — Encounter (HOSPITAL_COMMUNITY)
Admission: RE | Disposition: A | Discharge status: Home / Self Care | Payer: Self-pay | Source: Home / Self Care | Attending: General Surgery

## 2023-06-28 DIAGNOSIS — K660 Peritoneal adhesions (postprocedural) (postinfection): Secondary | ICD-10-CM | POA: Diagnosis present

## 2023-06-28 DIAGNOSIS — Z7984 Long term (current) use of oral hypoglycemic drugs: Secondary | ICD-10-CM

## 2023-06-28 DIAGNOSIS — E559 Vitamin D deficiency, unspecified: Secondary | ICD-10-CM | POA: Diagnosis present

## 2023-06-28 DIAGNOSIS — Z923 Personal history of irradiation: Secondary | ICD-10-CM

## 2023-06-28 DIAGNOSIS — M1710 Unilateral primary osteoarthritis, unspecified knee: Secondary | ICD-10-CM | POA: Diagnosis present

## 2023-06-28 DIAGNOSIS — Z79899 Other long term (current) drug therapy: Secondary | ICD-10-CM | POA: Diagnosis not present

## 2023-06-28 DIAGNOSIS — E1169 Type 2 diabetes mellitus with other specified complication: Secondary | ICD-10-CM | POA: Diagnosis present

## 2023-06-28 DIAGNOSIS — G4733 Obstructive sleep apnea (adult) (pediatric): Secondary | ICD-10-CM | POA: Diagnosis present

## 2023-06-28 DIAGNOSIS — Z833 Family history of diabetes mellitus: Secondary | ICD-10-CM | POA: Diagnosis not present

## 2023-06-28 DIAGNOSIS — Z7982 Long term (current) use of aspirin: Secondary | ICD-10-CM | POA: Diagnosis not present

## 2023-06-28 DIAGNOSIS — Z8249 Family history of ischemic heart disease and other diseases of the circulatory system: Secondary | ICD-10-CM

## 2023-06-28 DIAGNOSIS — F17290 Nicotine dependence, other tobacco product, uncomplicated: Secondary | ICD-10-CM | POA: Diagnosis present

## 2023-06-28 DIAGNOSIS — I4719 Other supraventricular tachycardia: Secondary | ICD-10-CM | POA: Diagnosis present

## 2023-06-28 DIAGNOSIS — Z794 Long term (current) use of insulin: Secondary | ICD-10-CM

## 2023-06-28 DIAGNOSIS — Z6841 Body Mass Index (BMI) 40.0 and over, adult: Secondary | ICD-10-CM | POA: Diagnosis not present

## 2023-06-28 DIAGNOSIS — E119 Type 2 diabetes mellitus without complications: Secondary | ICD-10-CM

## 2023-06-28 DIAGNOSIS — E66813 Obesity, class 3: Secondary | ICD-10-CM | POA: Diagnosis present

## 2023-06-28 DIAGNOSIS — Z9884 Bariatric surgery status: Principal | ICD-10-CM

## 2023-06-28 DIAGNOSIS — E785 Hyperlipidemia, unspecified: Secondary | ICD-10-CM | POA: Diagnosis present

## 2023-06-28 DIAGNOSIS — I10 Essential (primary) hypertension: Secondary | ICD-10-CM | POA: Diagnosis present

## 2023-06-28 HISTORY — PX: GASTRIC ROUX-EN-Y: SHX5262

## 2023-06-28 LAB — TYPE AND SCREEN
ABO/RH(D): A POS
Antibody Screen: NEGATIVE

## 2023-06-28 LAB — GLUCOSE, CAPILLARY
Glucose-Capillary: 118 mg/dL — ABNORMAL HIGH (ref 70–99)
Glucose-Capillary: 139 mg/dL — ABNORMAL HIGH (ref 70–99)
Glucose-Capillary: 152 mg/dL — ABNORMAL HIGH (ref 70–99)
Glucose-Capillary: 161 mg/dL — ABNORMAL HIGH (ref 70–99)
Glucose-Capillary: 188 mg/dL — ABNORMAL HIGH (ref 70–99)
Glucose-Capillary: 191 mg/dL — ABNORMAL HIGH (ref 70–99)
Glucose-Capillary: 230 mg/dL — ABNORMAL HIGH (ref 70–99)

## 2023-06-28 LAB — HEMOGLOBIN AND HEMATOCRIT, BLOOD
HCT: 48.7 % (ref 39.0–52.0)
Hemoglobin: 16 g/dL (ref 13.0–17.0)

## 2023-06-28 LAB — ABO/RH: ABO/RH(D): A POS

## 2023-06-28 SURGERY — LAPAROSCOPIC ROUX-EN-Y GASTRIC BYPASS WITH UPPER ENDOSCOPY
Anesthesia: General

## 2023-06-28 MED ORDER — LIDOCAINE HCL 2 % IJ SOLN
INTRAMUSCULAR | Status: AC
  Filled 2023-06-28: qty 20

## 2023-06-28 MED ORDER — BUPIVACAINE LIPOSOME 1.3 % IJ SUSP
20.0000 mL | Freq: Once | INTRAMUSCULAR | Status: DC
Start: 2023-06-28 — End: 2023-06-28

## 2023-06-28 MED ORDER — BUPIVACAINE LIPOSOME 1.3 % IJ SUSP
INTRAMUSCULAR | Status: AC
  Filled 2023-06-28: qty 20

## 2023-06-28 MED ORDER — PANTOPRAZOLE SODIUM 40 MG IV SOLR
40.0000 mg | Freq: Every day | INTRAVENOUS | Status: DC
  Administered 2023-06-28: 40 mg via INTRAVENOUS
  Filled 2023-06-28: qty 10

## 2023-06-28 MED ORDER — HYDROCHLOROTHIAZIDE 12.5 MG PO TABS
12.5000 mg | ORAL_TABLET | Freq: Every day | ORAL | Status: DC
  Administered 2023-06-28 – 2023-06-29 (×2): 12.5 mg via ORAL
  Filled 2023-06-28 (×2): qty 1

## 2023-06-28 MED ORDER — OXYCODONE HCL 5 MG/5ML PO SOLN: 5.0000 mg | Freq: Four times a day (QID) | ORAL | Status: DC | PRN

## 2023-06-28 MED ORDER — EPHEDRINE SULFATE-NACL 50-0.9 MG/10ML-% IV SOSY
PREFILLED_SYRINGE | INTRAVENOUS | Status: DC | PRN
  Administered 2023-06-28: 10 mg via INTRAVENOUS
  Administered 2023-06-28: 5 mg via INTRAVENOUS

## 2023-06-28 MED ORDER — INSULIN ASPART 100 UNIT/ML IJ SOLN
0.0000 [IU] | INTRAMUSCULAR | Status: DC
  Administered 2023-06-28 (×3): 4 [IU] via SUBCUTANEOUS
  Administered 2023-06-29: 3 [IU] via SUBCUTANEOUS

## 2023-06-28 MED ORDER — INSULIN ASPART 100 UNIT/ML IJ SOLN: 0.0000 [IU] | INTRAMUSCULAR | Status: DC | PRN

## 2023-06-28 MED ORDER — ALBUMIN HUMAN 5 % IV SOLN: INTRAVENOUS | Status: DC | PRN

## 2023-06-28 MED ORDER — ONDANSETRON HCL 4 MG/2ML IJ SOLN
INTRAMUSCULAR | Status: AC
  Filled 2023-06-28: qty 2

## 2023-06-28 MED ORDER — GABAPENTIN 100 MG PO CAPS
100.0000 mg | ORAL_CAPSULE | Freq: Two times a day (BID) | ORAL | Status: DC
  Administered 2023-06-28 – 2023-06-29 (×3): 100 mg via ORAL
  Filled 2023-06-28 (×3): qty 1

## 2023-06-28 MED ORDER — MIDAZOLAM HCL 5 MG/5ML IJ SOLN
INTRAMUSCULAR | Status: DC | PRN
  Administered 2023-06-28: 2 mg via INTRAVENOUS

## 2023-06-28 MED ORDER — SODIUM CHLORIDE 0.9 % IV SOLN
2.0000 g | INTRAVENOUS | Status: AC
  Administered 2023-06-28: 2 g via INTRAVENOUS
  Filled 2023-06-28: qty 2

## 2023-06-28 MED ORDER — FENTANYL CITRATE (PF) 100 MCG/2ML IJ SOLN
INTRAMUSCULAR | Status: AC
  Filled 2023-06-28: qty 2

## 2023-06-28 MED ORDER — LACTATED RINGERS IV SOLN: INTRAVENOUS | Status: DC | PRN

## 2023-06-28 MED ORDER — LACTATED RINGERS IR SOLN
Status: DC | PRN
  Administered 2023-06-28: 1000 mL

## 2023-06-28 MED ORDER — CHLORHEXIDINE GLUCONATE 0.12 % MT SOLN: 15.0000 mL | Freq: Once | OROMUCOSAL | Status: DC

## 2023-06-28 MED ORDER — PROPOFOL 10 MG/ML IV BOLUS
INTRAVENOUS | Status: DC | PRN
  Administered 2023-06-28: 200 mg via INTRAVENOUS

## 2023-06-28 MED ORDER — PROPOFOL 500 MG/50ML IV EMUL
INTRAVENOUS | Status: DC | PRN
  Administered 2023-06-28: 75 ug/kg/min via INTRAVENOUS

## 2023-06-28 MED ORDER — DEXAMETHASONE SODIUM PHOSPHATE 10 MG/ML IJ SOLN
INTRAMUSCULAR | Status: AC
  Filled 2023-06-28: qty 1

## 2023-06-28 MED ORDER — EPHEDRINE 5 MG/ML INJ
INTRAVENOUS | Status: AC
  Filled 2023-06-28: qty 5

## 2023-06-28 MED ORDER — KETAMINE HCL 50 MG/5ML IJ SOSY
PREFILLED_SYRINGE | INTRAMUSCULAR | Status: AC
  Filled 2023-06-28: qty 5

## 2023-06-28 MED ORDER — CHLORHEXIDINE GLUCONATE 4 % EX SOLN: Freq: Once | CUTANEOUS | Status: DC

## 2023-06-28 MED ORDER — ACETAMINOPHEN 500 MG PO TABS
1000.0000 mg | ORAL_TABLET | ORAL | Status: AC
Start: 2023-06-28 — End: 2023-06-28
  Administered 2023-06-28: 1000 mg via ORAL
  Filled 2023-06-28: qty 2

## 2023-06-28 MED ORDER — ACETAMINOPHEN 500 MG PO TABS
1000.0000 mg | ORAL_TABLET | Freq: Three times a day (TID) | ORAL | Status: DC
  Administered 2023-06-28 – 2023-06-29 (×4): 1000 mg via ORAL
  Filled 2023-06-28 (×4): qty 2

## 2023-06-28 MED ORDER — POTASSIUM CHLORIDE IN NACL 20-0.45 MEQ/L-% IV SOLN
INTRAVENOUS | Status: AC
  Filled 2023-06-28 (×4): qty 1000

## 2023-06-28 MED ORDER — ENOXAPARIN (LOVENOX) PATIENT EDUCATION KIT
PACK | Freq: Once | Status: DC
  Filled 2023-06-28: qty 1

## 2023-06-28 MED ORDER — ORAL CARE MOUTH RINSE: 15.0000 mL | Freq: Once | OROMUCOSAL | Status: DC

## 2023-06-28 MED ORDER — METOPROLOL TARTRATE 25 MG PO TABS
25.0000 mg | ORAL_TABLET | Freq: Two times a day (BID) | ORAL | Status: DC
  Administered 2023-06-29: 25 mg via ORAL
  Filled 2023-06-28: qty 1

## 2023-06-28 MED ORDER — LIDOCAINE HCL (CARDIAC) PF 100 MG/5ML IV SOSY
PREFILLED_SYRINGE | INTRAVENOUS | Status: DC | PRN
  Administered 2023-06-28: 100 mg via INTRAVENOUS

## 2023-06-28 MED ORDER — VISTASEAL 4 ML SINGLE DOSE KIT
4.0000 mL | PACK | Freq: Once | CUTANEOUS | Status: AC
  Administered 2023-06-28: 4 mL via TOPICAL
  Filled 2023-06-28: qty 4

## 2023-06-28 MED ORDER — SUCCINYLCHOLINE CHLORIDE 200 MG/10ML IV SOSY
PREFILLED_SYRINGE | INTRAVENOUS | Status: AC
  Filled 2023-06-28: qty 10

## 2023-06-28 MED ORDER — HYDRALAZINE HCL 20 MG/ML IJ SOLN: 10.0000 mg | INTRAMUSCULAR | Status: DC | PRN

## 2023-06-28 MED ORDER — ONDANSETRON HCL 4 MG/2ML IJ SOLN
INTRAMUSCULAR | Status: DC | PRN
  Administered 2023-06-28: 4 mg via INTRAVENOUS

## 2023-06-28 MED ORDER — BUPIVACAINE-EPINEPHRINE (PF) 0.25% -1:200000 IJ SOLN
INTRAMUSCULAR | Status: DC | PRN
  Administered 2023-06-28: 50 mL

## 2023-06-28 MED ORDER — 0.9 % SODIUM CHLORIDE (POUR BTL) OPTIME
TOPICAL | Status: DC | PRN
  Administered 2023-06-28: 1000 mL

## 2023-06-28 MED ORDER — OXYCODONE HCL 5 MG PO TABS: 5.0000 mg | ORAL_TABLET | Freq: Once | ORAL | Status: DC | PRN

## 2023-06-28 MED ORDER — LOSARTAN POTASSIUM 50 MG PO TABS
100.0000 mg | ORAL_TABLET | Freq: Every day | ORAL | Status: DC
  Administered 2023-06-28 – 2023-06-29 (×2): 100 mg via ORAL
  Filled 2023-06-28 (×2): qty 2

## 2023-06-28 MED ORDER — APREPITANT 40 MG PO CAPS
40.0000 mg | ORAL_CAPSULE | ORAL | Status: AC
  Administered 2023-06-28: 40 mg via ORAL
  Filled 2023-06-28: qty 1

## 2023-06-28 MED ORDER — SUGAMMADEX SODIUM 200 MG/2ML IV SOLN
INTRAVENOUS | Status: DC | PRN
  Administered 2023-06-28: 200 mg via INTRAVENOUS

## 2023-06-28 MED ORDER — ROCURONIUM BROMIDE 10 MG/ML (PF) SYRINGE
PREFILLED_SYRINGE | INTRAVENOUS | Status: AC
  Filled 2023-06-28: qty 10

## 2023-06-28 MED ORDER — MIDAZOLAM HCL 2 MG/2ML IJ SOLN
INTRAMUSCULAR | Status: AC
  Filled 2023-06-28: qty 2

## 2023-06-28 MED ORDER — ENSURE MAX PROTEIN PO LIQD
2.0000 [oz_av] | ORAL | Status: DC
  Administered 2023-06-29 (×6): 2 [oz_av] via ORAL

## 2023-06-28 MED ORDER — SCOPOLAMINE 1 MG/3DAYS TD PT72
1.0000 | MEDICATED_PATCH | TRANSDERMAL | Status: DC
  Administered 2023-06-28: 1.5 mg via TRANSDERMAL
  Filled 2023-06-28: qty 1

## 2023-06-28 MED ORDER — STERILE WATER FOR IRRIGATION IR SOLN
Status: DC | PRN
  Administered 2023-06-28: 500 mL

## 2023-06-28 MED ORDER — LIDOCAINE HCL (PF) 2 % IJ SOLN
INTRAMUSCULAR | Status: DC | PRN
  Administered 2023-06-28: 1.5 mg/kg/h via INTRADERMAL

## 2023-06-28 MED ORDER — OXYCODONE HCL 5 MG/5ML PO SOLN: 5.0000 mg | Freq: Once | ORAL | Status: DC | PRN

## 2023-06-28 MED ORDER — MORPHINE SULFATE (PF) 2 MG/ML IV SOLN: 1.0000 mg | INTRAVENOUS | Status: DC | PRN

## 2023-06-28 MED ORDER — FIBRIN SEALANT 2 ML SINGLE DOSE KIT
2.0000 mL | PACK | Freq: Once | CUTANEOUS | Status: AC
Start: 2023-06-28 — End: 2023-06-28
  Administered 2023-06-28: 2 mL via TOPICAL
  Filled 2023-06-28: qty 2

## 2023-06-28 MED ORDER — INSULIN ASPART 100 UNIT/ML IJ SOLN
6.0000 [IU] | Freq: Once | INTRAMUSCULAR | Status: AC
  Administered 2023-06-28: 6 [IU] via SUBCUTANEOUS

## 2023-06-28 MED ORDER — HEPARIN SODIUM (PORCINE) 5000 UNIT/ML IJ SOLN
5000.0000 [IU] | Freq: Three times a day (TID) | INTRAMUSCULAR | Status: DC
  Administered 2023-06-28 – 2023-06-29 (×3): 5000 [IU] via SUBCUTANEOUS
  Filled 2023-06-28 (×3): qty 1

## 2023-06-28 MED ORDER — PROPOFOL 500 MG/50ML IV EMUL
INTRAVENOUS | Status: AC
  Filled 2023-06-28: qty 50

## 2023-06-28 MED ORDER — ONDANSETRON HCL 4 MG/2ML IJ SOLN: 4.0000 mg | Freq: Four times a day (QID) | INTRAMUSCULAR | Status: DC | PRN

## 2023-06-28 MED ORDER — SIMETHICONE 80 MG PO CHEW: 80.0000 mg | CHEWABLE_TABLET | Freq: Four times a day (QID) | ORAL | Status: DC | PRN

## 2023-06-28 MED ORDER — ACETAMINOPHEN 160 MG/5ML PO SOLN: 1000.0000 mg | Freq: Three times a day (TID) | ORAL | Status: DC

## 2023-06-28 MED ORDER — AMISULPRIDE (ANTIEMETIC) 5 MG/2ML IV SOLN: 10.0000 mg | Freq: Once | INTRAVENOUS | Status: DC | PRN

## 2023-06-28 MED ORDER — DEXAMETHASONE SODIUM PHOSPHATE 4 MG/ML IJ SOLN
4.0000 mg | INTRAMUSCULAR | Status: AC
  Administered 2023-06-28: 4 mg via INTRAVENOUS

## 2023-06-28 MED ORDER — BUPIVACAINE-EPINEPHRINE 0.25% -1:200000 IJ SOLN
INTRAMUSCULAR | Status: AC
  Filled 2023-06-28: qty 1

## 2023-06-28 MED ORDER — LOSARTAN POTASSIUM-HCTZ 100-12.5 MG PO TABS: 1.0000 | ORAL_TABLET | Freq: Every day | ORAL | Status: DC

## 2023-06-28 MED ORDER — LIDOCAINE HCL (PF) 2 % IJ SOLN
INTRAMUSCULAR | Status: AC
  Filled 2023-06-28: qty 5

## 2023-06-28 MED ORDER — FENTANYL CITRATE (PF) 100 MCG/2ML IJ SOLN
INTRAMUSCULAR | Status: DC | PRN
  Administered 2023-06-28: 100 ug via INTRAVENOUS
  Administered 2023-06-28 (×2): 50 ug via INTRAVENOUS

## 2023-06-28 MED ORDER — GABAPENTIN 100 MG PO CAPS
100.0000 mg | ORAL_CAPSULE | ORAL | Status: AC
Start: 2023-06-28 — End: 2023-06-28
  Administered 2023-06-28: 100 mg via ORAL
  Filled 2023-06-28: qty 1

## 2023-06-28 MED ORDER — HEPARIN SODIUM (PORCINE) 5000 UNIT/ML IJ SOLN
5000.0000 [IU] | INTRAMUSCULAR | Status: AC
  Administered 2023-06-28: 5000 [IU] via SUBCUTANEOUS
  Filled 2023-06-28: qty 1

## 2023-06-28 MED ORDER — PROPOFOL 1000 MG/100ML IV EMUL
INTRAVENOUS | Status: AC
  Filled 2023-06-28: qty 100

## 2023-06-28 MED ORDER — ALBUMIN HUMAN 5 % IV SOLN
INTRAVENOUS | Status: AC
  Filled 2023-06-28: qty 250

## 2023-06-28 MED ORDER — FENTANYL CITRATE PF 50 MCG/ML IJ SOSY: 25.0000 ug | PREFILLED_SYRINGE | INTRAMUSCULAR | Status: DC | PRN

## 2023-06-28 MED ORDER — ROCURONIUM BROMIDE 100 MG/10ML IV SOLN
INTRAVENOUS | Status: DC | PRN
  Administered 2023-06-28: 10 mg via INTRAVENOUS
  Administered 2023-06-28: 20 mg via INTRAVENOUS
  Administered 2023-06-28: 70 mg via INTRAVENOUS

## 2023-06-28 MED ORDER — INSULIN ASPART 100 UNIT/ML IJ SOLN
INTRAMUSCULAR | Status: AC
  Filled 2023-06-28: qty 1

## 2023-06-28 MED ORDER — PROPOFOL 10 MG/ML IV BOLUS
INTRAVENOUS | Status: AC
  Filled 2023-06-28: qty 20

## 2023-06-28 MED ORDER — KETAMINE HCL 10 MG/ML IJ SOLN
INTRAMUSCULAR | Status: DC | PRN
  Administered 2023-06-28: 30 mg via INTRAVENOUS
  Administered 2023-06-28: 10 mg via INTRAVENOUS

## 2023-06-28 SURGICAL SUPPLY — 69 items
ANTIFOG SOL W/FOAM PAD STRL (MISCELLANEOUS) ×1
APPLICATOR COTTON TIP 6 STRL (MISCELLANEOUS) IMPLANT
APPLICATOR COTTON TIP 6IN STRL (MISCELLANEOUS)
APPLICATOR VISTASEAL 35 (MISCELLANEOUS) ×2 IMPLANT
APPLIER CLIP ROT 13.4 12 LRG (CLIP)
BAG COUNTER SPONGE SURGICOUNT (BAG) IMPLANT
BLADE SURG SZ11 CARB STEEL (BLADE) ×1 IMPLANT
CABLE HIGH FREQUENCY MONO STRZ (ELECTRODE) IMPLANT
CHLORAPREP W/TINT 26 (MISCELLANEOUS) ×2 IMPLANT
CLIP APPLIE ROT 13.4 12 LRG (CLIP) IMPLANT
CLIP SUT LAPRA TY ABSORB (SUTURE) ×2 IMPLANT
CUTTER FLEX LINEAR 45M (STAPLE) IMPLANT
DEVICE SUT QUICK LOAD TK 5 (SUTURE) IMPLANT
DEVICE SUT TI-KNOT TK 5X26 (SUTURE) IMPLANT
DEVICE SUTURE ENDOST 10MM (ENDOMECHANICALS) ×1 IMPLANT
DRAIN PENROSE 0.25X18 (DRAIN) ×1 IMPLANT
DRSG TEGADERM 2-3/8X2-3/4 SM (GAUZE/BANDAGES/DRESSINGS) ×6 IMPLANT
ELECT REM PT RETURN 15FT ADLT (MISCELLANEOUS) ×1 IMPLANT
GAUZE 4X4 16PLY ~~LOC~~+RFID DBL (SPONGE) ×1 IMPLANT
GAUZE SPONGE 2X2 8PLY STRL LF (GAUZE/BANDAGES/DRESSINGS) ×1 IMPLANT
GAUZE SPONGE 4X4 12PLY STRL (GAUZE/BANDAGES/DRESSINGS) IMPLANT
GLOVE BIO SURGEON STRL SZ7.5 (GLOVE) ×1 IMPLANT
GLOVE INDICATOR 8.0 STRL GRN (GLOVE) ×1 IMPLANT
GOWN STRL REUS W/ TWL XL LVL3 (GOWN DISPOSABLE) ×4 IMPLANT
IRRIG SUCT STRYKERFLOW 2 WTIP (MISCELLANEOUS) ×1
IRRIGATION SUCT STRKRFLW 2 WTP (MISCELLANEOUS) ×1 IMPLANT
KIT BASIN OR (CUSTOM PROCEDURE TRAY) ×1 IMPLANT
KIT GASTRIC LAVAGE 34FR ADT (SET/KITS/TRAYS/PACK) ×1 IMPLANT
KIT TURNOVER KIT A (KITS) IMPLANT
MARKER SKIN DUAL TIP RULER LAB (MISCELLANEOUS) ×1 IMPLANT
MAT PREVALON FULL STRYKER (MISCELLANEOUS) ×1 IMPLANT
NDL SPNL 22GX3.5 QUINCKE BK (NEEDLE) ×1 IMPLANT
NEEDLE SPNL 22GX3.5 QUINCKE BK (NEEDLE) ×1
PACK CARDIOVASCULAR III (CUSTOM PROCEDURE TRAY) ×1 IMPLANT
RELOAD 45 VASCULAR/THIN (ENDOMECHANICALS)
RELOAD STAPLE 45 2.5 WHT GRN (ENDOMECHANICALS) IMPLANT
RELOAD STAPLE 45 3.5 BLU ETS (ENDOMECHANICALS) IMPLANT
RELOAD STAPLE 60 2.6 WHT THN (STAPLE) ×2 IMPLANT
RELOAD STAPLE 60 3.6 BLU REG (STAPLE) ×2 IMPLANT
RELOAD STAPLE 60 3.8 GOLD REG (STAPLE) ×1 IMPLANT
RELOAD STAPLE TA45 3.5 REG BLU (ENDOMECHANICALS)
RELOAD SUT SNGL STCH ABSRB 2-0 (ENDOMECHANICALS) ×5 IMPLANT
RELOAD SUT SNGL STCH BLK 2-0 (ENDOMECHANICALS) ×4 IMPLANT
SCISSORS LAP 5X45 EPIX DISP (ENDOMECHANICALS) ×1 IMPLANT
SET TUBE SMOKE EVAC HIGH FLOW (TUBING) ×1 IMPLANT
SHEARS HARMONIC 45 ACE (MISCELLANEOUS) ×1 IMPLANT
SLEEVE ADV FIXATION 12X100MM (TROCAR) ×2 IMPLANT
SLEEVE ADV FIXATION 5X100MM (TROCAR) IMPLANT
SOLUTION ANTFG W/FOAM PAD STRL (MISCELLANEOUS) ×1 IMPLANT
STAPLER ECHELON BIOABSB 60 FLE (MISCELLANEOUS) IMPLANT
STAPLER ECHELON LONG 60 440 (INSTRUMENTS) ×1 IMPLANT
STAPLER RELOAD BLUE 60MM (STAPLE) ×4
STAPLER RELOAD GOLD 60MM (STAPLE) ×1
STAPLER RELOAD WHITE 60MM (STAPLE) ×2
STRIP CLOSURE SKIN 1/2X4 (GAUZE/BANDAGES/DRESSINGS) ×1 IMPLANT
SURGILUBE 2OZ TUBE FLIPTOP (MISCELLANEOUS) ×1 IMPLANT
SUT MNCRL AB 4-0 PS2 18 (SUTURE) ×1 IMPLANT
SUT RELOAD ENDO STITCH 2 48X1 (ENDOMECHANICALS) ×5
SUT RELOAD ENDO STITCH 2.0 (ENDOMECHANICALS) ×4
SUT SURGIDAC NAB ES-9 0 48 120 (SUTURE) IMPLANT
SUT VIC AB 2-0 SH 27X BRD (SUTURE) ×1 IMPLANT
SYR 20ML LL LF (SYRINGE) ×2 IMPLANT
TOWEL OR 17X26 10 PK STRL BLUE (TOWEL DISPOSABLE) ×1 IMPLANT
TOWEL OR NON WOVEN STRL DISP B (DISPOSABLE) ×1 IMPLANT
TRAY FOLEY MTR SLVR 16FR STAT (SET/KITS/TRAYS/PACK) IMPLANT
TROCAR ADV FIXATION 12X100MM (TROCAR) ×1 IMPLANT
TROCAR ADV FIXATION 5X100MM (TROCAR) ×1 IMPLANT
TROCAR XCEL NON-BLD 5MMX100MML (ENDOMECHANICALS) ×1 IMPLANT
TUBING CONNECTING 10 (TUBING) ×2 IMPLANT

## 2023-06-28 NOTE — Progress Notes (Signed)

## 2023-06-28 NOTE — Progress Notes (Addendum)
PHARMACY CONSULT FOR:  Risk Assessment for Post-Discharge VTE Following Bariatric Surgery  Procedure* Roux-en-Y gastric bypass  Sex Male  Black race Yes  Age (years) 62  BMI (kg/m2) 40  Operation duration (minutes) 116  History of VTE requiring treatment* No  Hypercoagulable condition* No  Liver disorder* No  Pre-op venous stasis No  Pre-op functional health status  Independent / Partially Dependent / Totally Dependent  Previous foregut or bariatric surg Hx ventral hernia repair  Post-op surgical site infection N/A  Transfusion intra- or post-op* No  Surgical site infection post-op N/A  Unplanned readmission N/A  Unplanned reoperation N/A  GI perforation/leak/obstruction* No  *specific risk factors for portomesenteric venous thrombosis   Predicted probability of 30-day post-discharge VTE:  0.48 % estimated using the [facility] Calculator  Recommendation for Discharge: Enoxaparin 40 mg [place] q12h x 2 weeks post-discharge This order has been placed in discharge orders for surgeon to review and sign as appropriate on discharge.   Colton Powell is a 62 y.o. male who underwent Roux-en-Y gastric bypass on 06/28/23   Case start: 0804 Case end: 1000   No Known Allergies  Patient Measurements: Weight: 130.4 kg (287 lb 8 oz) Body mass index is 40.1 kg/m.   Past Medical History:  Diagnosis Date   Arthritis    Diabetes mellitus without complication (HCC)    Family history of adverse reaction to anesthesia    Mom has confusion and N&V   High cholesterol    Takes atorvastatin   Hypertension    PONV (postoperative nausea and vomiting)    Sleep apnea    CPAP     Medications Prior to Admission  Medication Sig Dispense Refill Last Dose   APPLE CIDER VINEGAR PO Take 1 capsule by mouth daily.   Past Week   aspirin EC 81 MG tablet Take 81 mg by mouth daily. Swallow whole.   Past Week   atorvastatin (LIPITOR) 20 MG tablet Take 20 mg by mouth daily.    06/28/2023   Brimonidine Tartrate (LUMIFY) 0.025 % SOLN Place 1 drop into both eyes daily as needed (redness).   06/27/2023   Calcium Carbonate (CALCIUM 500 PO) Take 1,500 mg by mouth daily.   Past Week   empagliflozin (JARDIANCE) 25 MG TABS tablet Take 25 mg by mouth daily.   06/22/2023   losartan-hydrochlorothiazide (HYZAAR) 100-12.5 MG tablet Take 1 tablet by mouth daily.   06/27/2023   metFORMIN (GLUCOPHAGE) 500 MG tablet Take 1,000 mg by mouth 2 (two) times daily with a meal.   06/27/2023   metoprolol succinate (TOPROL-XL) 100 MG 24 hr tablet Take 100 mg by mouth daily.  3 06/28/2023 at 0400   Multiple Vitamin (MULTIVITAMIN WITH MINERALS) TABS tablet Take 1 tablet by mouth daily.   Past Week     Pricilla Riffle, PharmD, BCPS Clinical Pharmacist 06/28/2023 11:21 AM

## 2023-06-28 NOTE — Progress Notes (Signed)
   06/28/23 2200  BiPAP/CPAP/SIPAP  $ Non-Invasive Home Ventilator  Initial  BiPAP/CPAP/SIPAP Pt Type Adult (DS)  BiPAP/CPAP/SIPAP DREAMSTATIOND (Ours)  Mask Type Nasal mask (Pts. Own hose/Nasal Mask)  Mask Size Small  Respiratory Rate 18 breaths/min  Flow Rate 2 lpm (Added to circuit per current setting.)  Patient Home Equipment Yes (Only hose/Nasal mask)  Auto Titrate Yes (<4/>6)   Pt. Made aware to notify if needed, Family member remains at bedside.

## 2023-06-28 NOTE — Interval H&P Note (Signed)
History and Physical Interval Note:  06/28/2023 7:26 AM  Colton Powell  has presented today for surgery, with the diagnosis of MORBID OBESITY.  The various methods of treatment have been discussed with the patient and family. After consideration of risks, benefits and other options for treatment, the patient has consented to  Procedure(s): LAPAROSCOPIC ROUX-EN-Y GASTRIC BYPASS WITH UPPER ENDOSCOPY (N/A) as a surgical intervention.  The patient's history has been reviewed, patient examined, no change in status, stable for surgery.  I have reviewed the patient's chart and labs.  Questions were answered to the patient's satisfaction.     Gaynelle Adu

## 2023-06-28 NOTE — Transfer of Care (Signed)
Immediate Anesthesia Transfer of Care Note  Patient: Colton Powell  Procedure(s) Performed: LAPAROSCOPIC ROUX-EN-Y GASTRIC BYPASS WITH UPPER ENDOSCOPY AND LYSIS OF ADHESIONS  Patient Location: PACU  Anesthesia Type:General  Level of Consciousness: drowsy and patient cooperative  Airway & Oxygen Therapy: Patient Spontanous Breathing and Patient connected to face mask oxygen  Post-op Assessment: Report given to RN and Post -op Vital signs reviewed and stable  Post vital signs: Reviewed and stable  Last Vitals:  Vitals Value Taken Time  BP 139/90 06/28/23 1015  Temp 36.8 C 06/28/23 1013  Pulse 70 06/28/23 1015  Resp 22 06/28/23 1015  SpO2 96 % 06/28/23 1015  Vitals shown include unfiled device data.  Last Pain:  Vitals:   06/28/23 0610  TempSrc:   PainSc: 0-No pain         Complications: No notable events documented.

## 2023-06-28 NOTE — Op Note (Signed)
Herrick Hafeman [id number] September 09, 1960. 06/28/2023  Preoperative diagnosis:  Essential hypertension  Severe obesity BMI 40  Type 2 diabetes mellitus without complication, with long-term current use of insulin (CMS/HHS-HCC)  Sleep apnea with use of continuous positive airway pressure (CPAP)  Hyperlipidemia associated with type 2 diabetes mellitus (CMS/HHS-HCC)  History of ventral hernia repair  Low vitamin B12 level  Vitamin D insufficiency   Postoperative  diagnosis:  1. same  Surgical procedure: Laparoscopic Roux-en-Y gastric bypass (ante-colic, ante-gastric); upper endoscopy Laparoscopic lysis of adhesions 20 minutes  Surgeon: Atilano Ina, M.D. FACS  Asst.: Feliciana Rossetti MD FACS  Anesthesia: General plus exparel/marcaine mix  Complications: None   EBL: Minimal   Drains: None   Disposition: PACU in good condition   Indications for procedure: 62 y.o. yo male with morbid obesity who has been unsuccessful at sustained weight loss. The patient's comorbidities are listed above. We discussed the risk and benefits of surgery including but not limited to anesthesia risk, bleeding, infection, blood clot formation, anastomotic leak, anastomotic stricture, ulcer formation, death, respiratory complications, intestinal blockage, internal hernia, gallstone formation, vitamin and nutritional deficiencies, injury to surrounding structures, failure to lose weight and mood changes.   Description of procedure: Patient is brought to the operating room and general anesthesia induced. The patient had received preoperative broad-spectrum IV antibiotics and subcutaneous heparin. The abdomen was widely sterilely prepped with Chloraprep and draped. Patient timeout was performed and correct patient and procedure confirmed. Access was obtained with a 5 mm Optiview trocar in the left upper quadrant and pneumoperitoneum established without difficulty.  Upon insertion of the laparoscope there is no  evidence of injury to surrounding structures.  However the patient's omentum was essentially adhered to the entire piece of previously placed periumbilical mesh.  I went ahead and placed the left lateral abdominal wall 5 mm trocar.  Then using harmonic scalpel I started taking down the omental adhesions to the anterior abdominal wall including to the mesh.  I was able to get a large enough window in the upper abdomen to place my remaining trocars.  Under direct vision 12 mm trocars were placed laterally in the right upper quadrant, right upper quadrant midclavicular line, and to the left and above the umbilicus for the camera port.   Exparel/marcaine mix was infiltrated in bilateral lateral abdominal walls as a TAP block for postoperative pain relief.  We then went about taking down the remainder of the omental adhesions to the anterior abdominal wall with harmonic scalpel. The omentum was brought into the upper abdomen and the transverse mesocolon elevated and the ligament of Treitz clearly identified. A 50 cm biliopancreatic limb was then carefully measured from the ligament of Treitz. The small intestine was divided at this point with a single firing of the white load linear stapler. A Penrose drain was sutured to the end of the Roux-en-Y limb for later identification. A 100 cm Roux-en-Y limb was then carefully measured. At this point a side-to-side anastomosis was created between the Roux limb and the end of the biliopancreatic limb. This was accomplished with a single firing of the 60 mm white load linear stapler. The common enterotomy was closed with a running 2-0 Vicryl begun at either end of the enterotomy and tied centrally. Vistaseal tissue sealant was placed over the anastomosis. The mesenteric defect was then closed with running 2-0 silk. The omentum was then divided with the harmonic scalpel up towards the transverse colon to allow mobility of the Roux limb toward the gastric  pouch. The patient was then  placed in steep reversed Trendelenburg. Through a 5 mm subxiphoid site the [facility] retractor was placed and the left lobe of the liver elevated with excellent exposure of the upper stomach and hiatus. The angle of Hiss was then mobilized with the harmonic scalpel. A 5 cm gastric pouch was then carefully measured along the lesser curve of the stomach. Dissection was carried along the lesser curve at this point with the Harmonic scalpel working carefully back toward the lesser sac at right angles to the lesser curve. The free lesser sac was then entered. After being sure all tubes were removed from the stomach an initial firing of the gold load 60 mm linear stapler was fired at right angles across the lesser curve for about 4 cm. The gastric pouch was further mobilized posteriorly and then the pouch was completed with 3 further firings of the 60 mm blue load linear stapler up through the previously dissected angle of His. It was ensured that the pouch was completely mobilized away from the gastric remnant. This created a nice tubular 4-5 cm gastric pouch. The Roux limb was then brought up in an antecolic fashion with the candycane facing to the patient's left without undue tension. The gastrojejunostomy was created with an initial posterior row of 2-0 Vicryl between the Roux limb and the staple line of the gastric pouch. Enterotomies were then made in the gastric pouch and the Roux limb with the harmonic scalpel and at approximately 2-2-1/2 cm anastomosis was created with a single firing of the 60mm blue load linear stapler. The staple line was inspected and was intact without bleeding. The common enterotomy was then closed with running 2-0 Vicryl begun at either end and tied centrally. The Ewall tube was then easily passed through the anastomosis and an outer anterior layer of running 2-0 Vicryl was placed. The Ewald tube was removed. With the outlet of the gastrojejunostomy clamped and under saline irrigation the  assistant performed upper endoscopy and with the gastric pouch tensely distended with air-there was no evidence of leak on this test. The pouch was desufflated. The Vonita Moss defect was closed with running 2-0 silk. The abdomen was inspected for any evidence of bleeding or bowel injury and everything looked fine. The Nathanson retractor was removed under direct vision after coating the anastomosis with Vistaseal tissue sealant. All CO2 was evacuated and trochars removed. Skin incisions were closed with 4-0 monocryl in a subcuticular fashion followed by steri-strips and bandages. Sponge needle and instrument counts were correct. The patient was taken to the PACU in good condition.    [name]. Andrey Campanile, MD, FACS General, Bariatric, & Minimally Invasive Surgery [facility], [place]

## 2023-06-28 NOTE — Anesthesia Procedure Notes (Signed)
Procedure Name: Intubation Date/Time: 06/28/2023 7:42 AM  Performed by: Maurene Capes, CRNAPre-anesthesia Checklist: Patient identified, Emergency Drugs available, Suction available and Patient being monitored Patient Re-evaluated:Patient Re-evaluated prior to induction Oxygen Delivery Method: Circle System Utilized Preoxygenation: Pre-oxygenation with 100% oxygen Induction Type: IV induction Ventilation: Mask ventilation without difficulty Laryngoscope Size: Mac and 4 Grade View: Grade III Tube type: Oral Tube size: 8.0 mm Number of attempts: 1 Airway Equipment and Method: Oral airway and Rigid stylet Placement Confirmation: ETT inserted through vocal cords under direct vision, positive ETCO2 and breath sounds checked- equal and bilateral Secured at: 23 cm Tube secured with: Tape Dental Injury: Teeth and Oropharynx as per pre-operative assessment

## 2023-06-28 NOTE — Progress Notes (Signed)
Patient has not voided or ambulated due to drowsiness from surgery. Patient was bladder scanned and showed 360 ml. Per Basil Dess, we will give Colton Powell until 2000 to void before doing an in/out cath.

## 2023-06-28 NOTE — Op Note (Signed)
Preoperative diagnosis: Roux-en-Y gastric bypass  Postoperative diagnosis: Same   Procedure: Upper endoscopy   Surgeon: Feliciana Rossetti, M.D.  Anesthesia: Gen.   Indications for procedure: This patient was undergoing a Roux-en-Y gastric bypass.   Description of procedure: The endoscopy was placed in the mouth and into the oropharynx and under endoscopic vision it was advanced to the esophagogastric junction.  The stomach was insufflated and no bleeding or bubbles were seen.  The GEJ was identified at 39 cm from the teeth. The anastomosis was seen at 47 cm. It was widely patent and easily traversed. No bleeding or leaks were detected. The scope was withdrawn without difficulty.    Feliciana Rossetti, M.D. General, Bariatric, & Minimally Invasive Surgery [facility] [place]

## 2023-06-28 NOTE — H&P (Signed)
PROVIDER: Maureen Delatte Sherril Cong, MD  MRN: [id number] DOB: 05-14-1961 DATE OF ENCOUNTER: 06/10/2023 Subjective  Chief Complaint: return weight loss (Pre op lgb 06/28/23 )   History of Present Illness: Colton Powell is a 62 y.o. male who is seen today for long-term follow-up regarding his severe obesity and related comorbidities.  Comorbidities include diabetes mellitus type 2, obstructive sleep apnea, hypertension, hyperlipidemia, knee arthritis  I initially met him back in April of this year to discuss bariatric surgery. At that time his weight was 302 pounds with a BMI of 42.  Previous abdominal surgery included a supraumbilical hernia with a 4.5 inch round Ventralight ST mesh  He has completed his psychological and nutritional evaluation. He has completed his supervised weight loss requirements  He denies any medical changes since I last saw him other than one of his doctors increase one of his blood pressure medications. He denies any chest pain, chest pressure, shortness of breath, TIAs.  He does occasionally smoke a cigar and drinks some alcohol  Review of Systems: A complete review of systems was obtained from the patient. I have reviewed this information and discussed as appropriate with the patient. See HPI as well for other ROS.  ROS  Medical History: Past Medical History: Diagnosis Date Arthritis Diabetes mellitus without complication (CMS/HHS-HCC) Hyperlipidemia Hypertension Sleep apnea  Patient Active Problem List Diagnosis Essential hypertension with goal blood pressure less than 140/90 AVNRT (AV nodal re-entry tachycardia) (CMS/HHS-HCC) Hyperlipidemia associated with type 2 diabetes mellitus (CMS/HHS-HCC) Sleep apnea with use of continuous positive airway pressure (CPAP) Type 2 diabetes mellitus without complication, with long-term current use of insulin (CMS/HHS-HCC)  Past Surgical History: Procedure Laterality Date CARDIAC FOCAL ABLATION UTILIZING RADIATION  THERAPY 2018 back surgery HERNIA REPAIR knee surgery   No Known Allergies  Current Outpatient Medications on File Prior to Visit Medication Sig Dispense Refill aspirin 81 MG EC tablet Take by mouth atorvastatin (LIPITOR) 10 MG tablet Take 1 tablet by mouth once daily JARDIANCE 10 mg tablet Take 10 mg by mouth once daily losartan-hydroCHLOROthiazide (HYZAAR) 50-12.5 mg tablet Take 1 tablet by mouth once daily metFORMIN (GLUCOPHAGE) 500 MG tablet 500 mg daily with breakfast. metoprolol succinate (TOPROL-XL) 100 MG XL tablet 100 mg by oral route. multivitamin with minerals Cap Take 1 capsule by mouth once daily  No current facility-administered medications on file prior to visit.  Family History Problem Relation Age of Onset High blood pressure (Hypertension) Mother Hyperlipidemia (Elevated cholesterol) Mother Diabetes Mother Coronary Artery Disease (Blocked arteries around heart) Mother High blood pressure (Hypertension) Sister High blood pressure (Hypertension) Brother   Social History  Tobacco Use Smoking Status Some Days Types: Cigarettes Smokeless Tobacco Never   Social History  Socioeconomic History Marital status: Married Tobacco Use Smoking status: Some Days Types: Cigarettes Smokeless tobacco: Never Substance and Sexual Activity Alcohol use: Yes Drug use: Never  Social Drivers of Catering manager Strain: Low Risk (04/05/2023) Received from [facility] Overall Financial Resource Strain (CARDIA) Difficulty of Paying Living Expenses: Not very hard Food Insecurity: No Food Insecurity (04/05/2023) Received from [facility] Hunger Vital Sign Worried About Running Out of Food in the Last Year: Never true Ran Out of Food in the Last Year: Never true Transportation Needs: No Transportation Needs (04/05/2023) Received from [facility] - Transportation Lack of Transportation (Medical): No Lack of Transportation (Non-Medical):  No Physical Activity: Insufficiently Active (04/05/2023) Received from [facility] Exercise Vital Sign Days of Exercise per Week: 2 days Minutes of Exercise per Session:  10 min Stress: No Stress Concern Present (04/05/2023) Received from [facility] of Occupational Health - Occupational Stress Questionnaire Feeling of Stress : Not at all Social Connections: Moderately Integrated (04/05/2023) Received from [facility] Social Network How would you rate your social network (family, work, friends)?: Adequate participation with social networks Housing Stability: Low Risk (04/05/2023) Received from [facility] Stability Vital Sign Unable to Pay for Housing in the Last Year: No Number of Places Lived in the Last Year: 1 Unstable Housing in the Last Year: No  Objective:  Vitals: 06/10/23 1019 BP: (!) 144/90 Pulse: 78 Resp: 18 Temp: 36.8 C (98.3 F) Weight: (!) 131.9 kg (290 lb 12.8 oz) Height: 180.3 cm (5\' 11" ) PainSc: 0-No pain  Body mass index is 40.56 kg/m.  Gen: alert, NAD, non-toxic appearing Pupils: equal, no scleral icterus Pulm: Lungs clear to auscultation, symmetric chest rise CV: regular rate and rhythm Abd: soft, nontender, nondistended. old trocar sites. No cellulitis. No incisional hernia Ext: no edema, Skin: no rash, no jaundice  Labs, Imaging and Diagnostic Testing: Labs 04/22/23 Bmet ok except blood sugar 110, Iron panel normal-iron level 88, ferritin 136 Vitamin D low at 21.7 CBC normal B12 level low at 213 H. pylori breath test negative  PCP annual note 04/06/23  02/01/23 - upper gi - ok; cxr - ok Cards note 01/15/23 - [facility]  Hemo lobin A1c April 06, 2023 was 7.2 Labs 08/04/22 - cmet, A1c 7.8 Cbc 02/24/22 - wbc 3.9 (chronic), hgb/hct/plt ok; lipids tc 177, tg 129, hdl 59, ldl 95  Cardiac stress test 12/22/21 [facility] Comparison of stress and rest images revealed no reversible defects by myocardial perfusion  imaging.  The stress gated images revealed left ventricular ejection fraction of 47 % with normal wall motion.  Impression:  No evidence of inducible ischemia. Low normal left ventricular function (subjectively EF looks okay would confirm with echo for more accurate EF assessment)  Fair exercise tolerance (5 minutes), 7.2 METS. Short burst of WCT (10-15 beats)in early stage II which resolved prior to recovery. No symptoms with WCT. Frequent PVCs at peak exercise. No ischemic ST depressions noted.  Echo 12/22/21 'Left Ventricle: Systolic function is normal. EF: 55-60%. Left Ventricle: There is mild hypertrophy. Right Ventricle: Right ventricle size is normal. Right Ventricle: Systolic function is normal. Mitral Valve: There is trace regurgitation.  There are no significant valve abnormalities noted.  Assessment and Plan: Diagnoses and all orders for this visit:  Essential hypertension  Severe obesity (CMS/HHS-HCC)  Type 2 diabetes mellitus without complication, with long-term current use of insulin (CMS/HHS-HCC)  Sleep apnea with use of continuous positive airway pressure (CPAP)  Hyperlipidemia associated with type 2 diabetes mellitus (CMS/HHS-HCC)  History of ventral hernia repair  Low vitamin B12 level  Vitamin D insufficiency    We reviewed his preoperative workup. We discussed his chest x-ray and upper GI. We discussed his labs. He does have a low B12 level and vitamin D. I encouraged him to start taking a bariatric multivitamin now. We discussed some different bariatric multivitamins. He has his preoperative education class next week. I told him to call the office if he had any additional questions after that class. He reviewed the surgical consent form and signed it. Patient had opportunity to ask questions which I answered. We rediscussed the typical hospitalization and the typical recovery. We rediscussed the diet transition after surgery. We discussed the critical  importance of stopping cigar smoking after surgery.  This patient encounter took  25 minutes today to perform the following: take history, perform exam, review outside records, interpret imaging, counsel the patient on their diagnosis and document encounter, findings & plan in the EHR  No follow-ups on file.  [name]. Andrey Campanile MD FACS General, Minimally Invasive, & Bariatric Surgery Electronically signed by Gara Kroner, MD at 06/10/2023 10:44 AM EST

## 2023-06-28 NOTE — Anesthesia Postprocedure Evaluation (Signed)
Anesthesia Post Note  Patient: Suezanne Jacquet  Procedure(s) Performed: LAPAROSCOPIC ROUX-EN-Y GASTRIC BYPASS WITH UPPER ENDOSCOPY AND LYSIS OF ADHESIONS     Patient location during evaluation: PACU Anesthesia Type: General Level of consciousness: awake Pain management: pain level controlled Vital Signs Assessment: post-procedure vital signs reviewed and stable Respiratory status: spontaneous breathing, nonlabored ventilation and respiratory function stable Cardiovascular status: blood pressure returned to baseline and stable Postop Assessment: no apparent nausea or vomiting Anesthetic complications: no   No notable events documented.  Last Vitals:  Vitals:   06/28/23 1059 06/28/23 1158  BP: (!) 162/102 (!) 153/99  Pulse: 68 66  Resp: 16 17  Temp: 36.6 C 36.4 C  SpO2: 96% 97%    Last Pain:  Vitals:   06/28/23 1158  TempSrc: Oral  PainSc:                  Linton Rump

## 2023-06-29 ENCOUNTER — Encounter (HOSPITAL_COMMUNITY): Payer: Self-pay | Admitting: General Surgery

## 2023-06-29 ENCOUNTER — Other Ambulatory Visit (HOSPITAL_COMMUNITY): Payer: Self-pay

## 2023-06-29 LAB — CBC WITH DIFFERENTIAL/PLATELET
Abs Immature Granulocytes: 0.01 10*3/uL (ref 0.00–0.07)
Basophils Absolute: 0 10*3/uL (ref 0.0–0.1)
Basophils Relative: 0 %
Eosinophils Absolute: 0 10*3/uL (ref 0.0–0.5)
Eosinophils Relative: 0 %
HCT: 45.6 % (ref 39.0–52.0)
Hemoglobin: 14.5 g/dL (ref 13.0–17.0)
Immature Granulocytes: 0 %
Lymphocytes Relative: 18 %
Lymphs Abs: 1.1 10*3/uL (ref 0.7–4.0)
MCH: 29.5 pg (ref 26.0–34.0)
MCHC: 31.8 g/dL (ref 30.0–36.0)
MCV: 92.7 fL (ref 80.0–100.0)
Monocytes Absolute: 0.7 10*3/uL (ref 0.1–1.0)
Monocytes Relative: 11 %
Neutro Abs: 4.3 10*3/uL (ref 1.7–7.7)
Neutrophils Relative %: 71 %
Platelets: 197 10*3/uL (ref 150–400)
RBC: 4.92 MIL/uL (ref 4.22–5.81)
RDW: 14 % (ref 11.5–15.5)
WBC: 6.2 10*3/uL (ref 4.0–10.5)
nRBC: 0 % (ref 0.0–0.2)

## 2023-06-29 LAB — GLUCOSE, CAPILLARY
Glucose-Capillary: 123 mg/dL — ABNORMAL HIGH (ref 70–99)
Glucose-Capillary: 101 mg/dL — ABNORMAL HIGH (ref 70–99)
Glucose-Capillary: 115 mg/dL — ABNORMAL HIGH (ref 70–99)

## 2023-06-29 LAB — COMPREHENSIVE METABOLIC PANEL
ALT: 119 U/L — ABNORMAL HIGH (ref 0–44)
AST: 66 U/L — ABNORMAL HIGH (ref 15–41)
Albumin: 3.6 g/dL (ref 3.5–5.0)
Alkaline Phosphatase: 47 U/L (ref 38–126)
Anion gap: 10 (ref 5–15)
BUN: 14 mg/dL (ref 8–23)
CO2: 25 mmol/L (ref 22–32)
Calcium: 8.7 mg/dL — ABNORMAL LOW (ref 8.9–10.3)
Chloride: 101 mmol/L (ref 98–111)
Creatinine, Ser: 0.93 mg/dL (ref 0.61–1.24)
GFR, Estimated: >60 mL/min (ref >60)
Glucose, Bld: 110 mg/dL — ABNORMAL HIGH (ref 70–99)
Potassium: 3.4 mmol/L — ABNORMAL LOW (ref 3.5–5.1)
Sodium: 136 mmol/L (ref 135–145)
Total Bilirubin: 1.3 mg/dL — ABNORMAL HIGH (ref <1.2)
Total Protein: 6.9 g/dL (ref 6.5–8.1)

## 2023-06-29 MED ORDER — METOPROLOL TARTRATE 50 MG PO TABS: 50.0000 mg | ORAL_TABLET | Freq: Two times a day (BID) | ORAL | 3 refills | Status: AC

## 2023-06-29 MED ORDER — ACETAMINOPHEN 500 MG PO TABS: 1000.0000 mg | ORAL_TABLET | Freq: Three times a day (TID) | ORAL | Status: AC

## 2023-06-29 MED ORDER — ENOXAPARIN SODIUM 40 MG/0.4ML IJ SOSY
40.0000 mg | PREFILLED_SYRINGE | Freq: Two times a day (BID) | INTRAMUSCULAR | 0 refills | Status: AC
  Filled 2023-06-29: qty 11.2, 14d supply, fill #0

## 2023-06-29 MED ORDER — GABAPENTIN 100 MG PO CAPS: 100.0000 mg | ORAL_CAPSULE | Freq: Two times a day (BID) | ORAL | 0 refills | Status: AC

## 2023-06-29 MED ORDER — PANTOPRAZOLE SODIUM 40 MG PO TBEC: 40.0000 mg | DELAYED_RELEASE_TABLET | Freq: Every day | ORAL | 0 refills | Status: AC

## 2023-06-29 MED ORDER — TRAMADOL HCL 50 MG PO TABS: 50.0000 mg | ORAL_TABLET | Freq: Four times a day (QID) | ORAL | 0 refills | Status: AC | PRN

## 2023-06-29 MED ORDER — ONDANSETRON 4 MG PO TBDP: 4.0000 mg | ORAL_TABLET | Freq: Four times a day (QID) | ORAL | 0 refills | Status: AC | PRN

## 2023-06-29 NOTE — Inpatient Diabetes Management (Signed)
Inpatient Diabetes Program Recommendations  AACE/ADA: New Consensus Statement on Inpatient Glycemic Control (2015)  Target Ranges:  Prepandial:   less than 140 mg/dL      Peak postprandial:   less than 180 mg/dL (1-2 hours)      Critically ill patients:  140 - 180 mg/dL   Lab Results  Component Value Date   GLUCAP 101 (H) 06/29/2023   HGBA1C 6.7 (H) 06/23/2023    Review of Glycemic Control  Diabetes history: DM2 Outpatient Diabetes medications: metformin 1000 mg BID, Jardiance 25 mg daily (has not taken in over a week) Current orders for Inpatient glycemic control: Novolog 0-20 units Q4H  HgbA1C - 6.7%  Inpatient Diabetes Program Recommendations:    For discharge:  Metformin 1000 mg BID  Would not restart Jardiance until pt sees PCP in January.  Do not think he needs to go home on s/s insulin.  Spoke with pt at bedside regarding his diabetes management. States HgbA1C has decreased from 7.4% down to 6.7% as he has lost weight recently. Doubtful he will need s/s insulin at home. Instructed pt to monitor his blood sugars at least 3x/day and contact PCP if blood sugars continuously run > 180 mg/dL. Pt voices understanding. Has meter and supplies at home. Discussed hypoglycemia s/s and treatment.  Pt appreciative of visit.  Discussed above with RN.  Thank you. Ailene Ards, RD, LDN, CDCES Inpatient Diabetes Coordinator [phone]

## 2023-06-29 NOTE — H&P (Signed)
Transition of Care [facility]) - Inpatient Brief Assessment   Patient Details  Name: Colton Powell MRN: [id number] Date of Birth: 28-Mar-1961  Transition of Care [facility]) CM/SW Contact:    Darleene Cleaver, LCSW Phone Number: 06/29/2023, 12:20 PM   Clinical Narrative:  Patient does not have any SDOH needs, he has insurance and a PCP.  No anticipated TOC needs.  Transition of Care Asessment: Insurance and Status: Insurance coverage has been reviewed Patient has primary care physician: Yes Home environment has been reviewed: Yes lives with wife. Prior level of function:: Indep Prior/Current Home Services: No current home services Social Determinants of Health Reivew: SDOH reviewed no interventions necessary Readmission risk has been reviewed: Yes Transition of care needs: no transition of care needs at this time

## 2023-06-29 NOTE — Progress Notes (Signed)
Patient alert and oriented, pain is controlled. Patient is tolerating fluids, advanced to protein shake today, patient is tolerating well. Reviewed Gastric sleeve/bypass discharge instructions with patient and patient is able to articulate understanding. Provided information on BELT program, Support Group, BSTOP-D, and [facility] outpatient pharmacy. Communicated general update of patient status to surgeon. All questions answered. 24hr fluid recall is Lovenox education kit provided to patient per hydration protocol, bariatric nurse coordinator to make follow-up phone call within one week.

## 2023-06-29 NOTE — Progress Notes (Signed)
Patient was given discharge instructions, and all questions were answered.  Patient was stable for discharge and was taken to the main exit by wheelchair. 

## 2023-07-01 NOTE — Discharge Summary (Signed)
Physician Discharge Summary  Haki Passley UEA:[id number] DOB: [birth year] DOA: 06/28/2023  PCP: Vianne Bulls, MD  Admit date: 06/28/2023 Discharge date: 06/29/2023  Recommendations for Outpatient Follow-up:     Follow-up Information     Gaynelle Adu, MD Follow up on 07/28/2023.   Specialty: General Surgery Why: Please arrive 15 minutes prior to your appointment at 8:45am Contact information: [address] [place] [address] [phone]         Reine Just, [place] Follow up on 08/17/2023.   Specialty: General Surgery Why: Please arrive 15 minutes prior to your appointment at 2:15pm with Herbert Pun on behalf of Dr. Lanier Prude information: [address] [place] [address] [phone]                Discharge Diagnoses:  Principal Problem:   S/P gastric bypass Essential hypertension  Severe obesity BMI 40  Type 2 diabetes mellitus without complication, with long-term current use of insulin (CMS/HHS-HCC)  Sleep apnea with use of continuous positive airway pressure (CPAP)  Hyperlipidemia associated with type 2 diabetes mellitus (CMS/HHS-HCC)  History of ventral hernia repair  Low vitamin B12 level  Vitamin D insufficiency   Surgical Procedure: Laparoscopic Roux-en-Y gastric bypass with lysis of adhesions, upper endoscopy  Discharge Condition: Good Disposition: Home  Diet recommendation: Postoperative gastric bypass diet  Filed Weights   06/28/23 0551 06/28/23 1158  Weight: 130.4 kg 130.4 kg     Hospital Course:  The patient was admitted for a planned laparoscopic Roux-en-Y gastric bypass. Please see operative note. Preoperatively the patient was given 5000 units of subcutaneous heparin for DVT prophylaxis. ERAS protocol was used. Postoperative prophylactic heparin dosing was started on the evening of postoperative day 0.  The patient was started on ice chips and water on the evening of POD 0 which they  tolerated. On postoperative day 1 The patient's diet was advanced to protein shakes which they also tolerated. On POD 1, The patient was ambulating without difficulty. Their vital signs are stable without fever or tachycardia. Their hemoglobin had remained stable.. The patient had received discharge instructions and counseling. They were deemed stable for discharge.  He met criteria for extended vte prophylaxis and was sent out on prophylactic lovenox dosing. Diabetes nurse also saw pt and gave recs for med on discharge  BP (!) 166/96 (BP Location: Left Arm)   Pulse 62   Temp 98.4 F (36.9 C) (Oral)   Resp 17   Ht 5\' 11"  (1.803 m)   Wt 130.4 kg   SpO2 100%   BMI 40.10 kg/m   Gen: alert, NAD, non-toxic appearing Pupils: equal, no scleral icterus Pulm: Lungs clear to auscultation, symmetric chest rise CV: regular rate and rhythm Abd: soft, min tender, nondistended. No cellulitis. No incisional hernia Ext: no edema, no calf tenderness Skin: no rash, no jaundice  Discharge Instructions  Discharge Instructions     Ambulate hourly while awake   Complete by: As directed    Call MD for:  difficulty breathing, headache or visual disturbances   Complete by: As directed    Call MD for:  persistant dizziness or light-headedness   Complete by: As directed    Call MD for:  persistant nausea and vomiting   Complete by: As directed    Call MD for:  redness, tenderness, or signs of infection (pain, swelling, redness, odor or green/yellow discharge around incision site)   Complete by: As directed    Call MD  for:  severe uncontrolled pain   Complete by: As directed    Call MD for:  temperature >101 F   Complete by: As directed    Diet bariatric full liquid   Complete by: As directed    Discharge instructions   Complete by: As directed    See bariatric discharge instructions   Incentive spirometry   Complete by: As directed    Perform hourly while awake      Allergies as of 06/29/2023    No Known Allergies      Medication List     STOP taking these medications    aspirin EC 81 MG tablet   Jardiance 25 MG Tabs tablet Generic drug: empagliflozin   metoprolol succinate 100 MG 24 hr tablet Commonly known as: TOPROL-XL       TAKE these medications    acetaminophen 500 MG tablet Commonly known as: TYLENOL Take 2 tablets (1,000 mg total) by mouth every 8 (eight) hours for 5 days.   APPLE CIDER VINEGAR PO Take 1 capsule by mouth daily.   atorvastatin 20 MG tablet Commonly known as: LIPITOR Take 20 mg by mouth daily.   CALCIUM 500 PO Take 1,500 mg by mouth daily.   enoxaparin 40 MG/0.4ML injection Commonly known as: LOVENOX Inject 0.4 mLs (40 mg total) into the skin every 12 (twelve) hours for 14 days.   gabapentin 100 MG capsule Commonly known as: NEURONTIN Take 1 capsule (100 mg total) by mouth every 12 (twelve) hours for 5 days.   losartan-hydrochlorothiazide 100-12.5 MG tablet Commonly known as: HYZAAR Take 1 tablet by mouth daily. Notes to patient: Monitor Blood Pressure Daily and keep a log for primary care physician.  Monitor for symptoms of dehydration.  Your physician may need to make changes to your medications with rapid weight loss.      Lumify 0.025 % Soln Generic drug: Brimonidine Tartrate Place 1 drop into both eyes daily as needed (redness).   metFORMIN 500 MG tablet Commonly known as: GLUCOPHAGE Take 1,000 mg by mouth 2 (two) times daily with a meal. Notes to patient: Monitor Blood Sugar Frequently and keep a log for primary care physician, your physician may need to adjust medication dosage with rapid weight loss.      metoprolol tartrate 50 MG tablet Commonly known as: Lopressor Take 1 tablet (50 mg total) by mouth 2 (two) times daily.   multivitamin with minerals Tabs tablet Take 1 tablet by mouth daily.   ondansetron 4 MG disintegrating tablet Commonly known as: ZOFRAN-ODT Take 1 tablet (4 mg total) by mouth every 6  (six) hours as needed for nausea or vomiting.   pantoprazole 40 MG tablet Commonly known as: PROTONIX Take 1 tablet (40 mg total) by mouth daily.   traMADol 50 MG tablet Commonly known as: ULTRAM Take 1 tablet (50 mg total) by mouth every 6 (six) hours as needed (pain).        Follow-up Information     Gaynelle Adu, MD Follow up on 07/28/2023.   Specialty: General Surgery Why: Please arrive 15 minutes prior to your appointment at 8:45am Contact information: [address] [place] [address] [phone]         Reine Just, [place] Follow up on 08/17/2023.   Specialty: General Surgery Why: Please arrive 15 minutes prior to your appointment at 2:15pm with Herbert Pun on behalf of Dr. Lanier Prude information: [address] [place] [address] [phone]  The results of significant diagnostics from this hospitalization (including imaging, microbiology, ancillary and laboratory) are listed below for reference.    Significant Diagnostic Studies: No results found.  Labs: Basic Metabolic Panel: Recent Labs  Lab 06/29/23 0401  NA 136  K 3.4*  CL 101  CO2 25  GLUCOSE 110*  BUN 14  CREATININE 0.93  CALCIUM 8.7*   Liver Function Tests: Recent Labs  Lab 06/29/23 0401  AST 66*  ALT 119*  ALKPHOS 47  BILITOT 1.3*  PROT 6.9  ALBUMIN 3.6    CBC: Recent Labs  Lab 06/28/23 1423 06/29/23 0401  WBC  --  6.2  NEUTROABS  --  4.3  HGB 16.0 14.5  HCT 48.7 45.6  MCV  --  92.7  PLT  --  197    CBG: Recent Labs  Lab 06/28/23 1955 06/28/23 2350 06/29/23 0350 06/29/23 0713 06/29/23 1126  GLUCAP 161* 118* 123* 101* 115*    Principal Problem:   S/P gastric bypass   Time coordinating discharge: 20 min  Signed:  Atilano Ina, MD [facility] [phone] 07/01/2023, 1:52 PM

## 2023-07-06 NOTE — Telephone Encounter (Signed)
Post-op follow-up attempt  Voicemail left with contact info.

## 2023-07-12 ENCOUNTER — Encounter: Payer: Medicare (Managed Care) | Attending: General Surgery | Admitting: Dietician

## 2023-07-12 ENCOUNTER — Encounter: Payer: Self-pay | Admitting: Dietician

## 2023-07-12 VITALS — Ht 71.0 in | Wt 261.2 lb

## 2023-07-12 DIAGNOSIS — Z9884 Bariatric surgery status: Secondary | ICD-10-CM | POA: Insufficient documentation

## 2023-07-12 DIAGNOSIS — I10 Essential (primary) hypertension: Secondary | ICD-10-CM | POA: Diagnosis not present

## 2023-07-12 DIAGNOSIS — E669 Obesity, unspecified: Secondary | ICD-10-CM | POA: Diagnosis present

## 2023-07-12 DIAGNOSIS — Z713 Dietary counseling and surveillance: Secondary | ICD-10-CM | POA: Diagnosis not present

## 2023-07-12 DIAGNOSIS — E785 Hyperlipidemia, unspecified: Secondary | ICD-10-CM | POA: Insufficient documentation

## 2023-07-12 DIAGNOSIS — Z6836 Body mass index (BMI) 36.0-36.9, adult: Secondary | ICD-10-CM | POA: Insufficient documentation

## 2023-07-12 DIAGNOSIS — G4730 Sleep apnea, unspecified: Secondary | ICD-10-CM | POA: Insufficient documentation

## 2023-07-12 NOTE — Progress Notes (Signed)
2 Week Post-Operative Nutrition Class   Patient was seen on 07/12/2023 for Post-Operative Nutrition education at the [facility].    Surgery date: 06/28/2023 Surgery type: RYGB  Anthropometrics  Start weight at [facility]: 299.0 lbs (date: 11/16/2022)  Height: 71 in   Clinical   Pharmacotherapy: History of weight loss medication used: none  Medical hx: hyperlipidemia, sleep apnea, HTN Medications: atorvastatin, metformin, losartan, jardiance, metoprolol succinate  Labs: A1c 7.8; glucose 146 Notable signs/symptoms: none noted Any previous deficiencies? No Bowel Habits: Every day to every other day no complaints   Body Composition Scale 07/12/2023  Current Body Weight 261.2  Total Body Fat % 33.8  Visceral Fat 25  Fat-Free Mass % 66.1   Total Body Water % 47.1  Muscle-Mass lbs 49.2  BMI 36.2  Body Fat Displacement          Torso  lbs 54.8         Left Leg  lbs 10.9         Right Leg  lbs 10.9         Left Arm  lbs 5.4         Right Arm  lbs 5.4    The following the learning objectives were met by the patient during this course: Identifies Soft Prepped Plan Advancement Guide  Identifies Soft, High Proteins (Phase 1), beginning 2 weeks post-operatively to 3 weeks post-operatively Identifies Additional Soft High Proteins, soft non-starchy vegetables, fruits and starches (Phase 2), beginning 3 weeks post-operatively to 3 months post-operatively Identifies appropriate sources of fluids, proteins, vegetables, fruits and starches Identifies appropriate fat sources and healthy verses unhealthy fat types   States protein, vegetable, fruit and starch recommendations and appropriate sources post-operatively Identifies the need for appropriate texture modifications, mastication, and bite sizes when consuming solids Identifies appropriate fat consumption and sources Identifies appropriate multivitamin and calcium sources post-operatively Describes the need  for physical activity post-operatively and will follow MD recommendations States when to call healthcare provider regarding medication questions or post-operative complications   Handouts given during class include: Soft Prepped Plan Advancement Guide   Follow-Up Plan: Patient will follow-up at [facility] in 10 weeks for 3 month post-op nutrition visit for diet advancement per MD.

## 2023-07-13 ENCOUNTER — Ambulatory Visit: Payer: Medicare (Managed Care)

## 2023-07-22 ENCOUNTER — Telehealth: Payer: Self-pay | Admitting: Dietician

## 2023-07-22 NOTE — Telephone Encounter (Signed)
 RD called pt to verify fluid intake once starting soft, solid proteins 2 week post-bariatric surgery.   Daily Fluid intake:  Daily Protein intake:  Bowel Habits:   Concerns/issues:   Left Voice Message with call back number

## 2023-07-26 ENCOUNTER — Telehealth (HOSPITAL_COMMUNITY): Payer: Self-pay | Admitting: *Deleted

## 2023-07-26 NOTE — Telephone Encounter (Signed)
 1. Tell me about your pain and pain management?    Pt denies any pain.   2. Let's talk about fluid intake. How much total fluid are you taking in?   Pt states that s/he is working to meet goal of 64 oz of fluid today. Pt encouraged to continue to work towards meeting goal. Pt instructed to assess status and suggestions daily utilizing Hydration Action Plan on discharge folder and to call [facility] if in the red zone.     3. How much protein have you taken in the last day?   Pt states that he is working to meet the goal of 80g of protein today. Pt plans to drink the reminder of goal throughout the day to meet criteria.     4. Have you had nausea? Tell me about when you have experienced nausea and what you did to help?   Pt denies nausea.   5. Have you been passing gas? BM?   Pt states that they are having BMs.   Pt states that they have had a BM. Pt instructed to take either Miralax  or MoM as instructed per [facility] Instructions. Pt to call surgeon's office if not able to have BM with medication.     6. If a problem or question were to arise who would you call? Do you know contact numbers for [facility], [facility], and [facility]?   Pt knows to call [facility] for surgical, [facility] for nutrition, and [facility] for non-urgent questions or concerns. Pt denies dehydration symptoms. Pt can describe s/sx of dehydration.   7. How has the walking going?   Pt states s/he is walking around and able to be active without difficulty.    8. How has the anticoagulant Lovenox  been going?   LOVENOX : Pt states that he completed taking the Lovenox  injections without difficulty.

## 2023-08-23 ENCOUNTER — Other Ambulatory Visit (HOSPITAL_COMMUNITY): Payer: Self-pay

## 2023-08-27 ENCOUNTER — Encounter: Payer: Self-pay | Admitting: Dietician

## 2023-08-27 ENCOUNTER — Encounter: Payer: Medicare (Managed Care) | Attending: General Surgery | Admitting: Dietician

## 2023-08-27 VITALS — Ht 71.0 in | Wt 246.1 lb

## 2023-08-27 DIAGNOSIS — E669 Obesity, unspecified: Secondary | ICD-10-CM | POA: Diagnosis present

## 2023-08-27 NOTE — Progress Notes (Signed)
 Bariatric Nutrition Follow-Up Visit Medical Nutrition Therapy  Appt Start Time: 0901   End Time: 0950  Surgery date: 06/28/2023 Surgery type: RYGB  NUTRITION ASSESSMENT  Anthropometrics  Start weight at [facility]: 299.0 lbs (date: 11/16/2022)  Height: 71 in Weight today: 246.1 lbs   Clinical   Pharmacotherapy: History of weight loss medication used: none  Medical hx: hyperlipidemia, sleep apnea, HTN Medications: atorvastatin , metformin, losartan , jardiance, metoprolol  succinate  Labs: A1c 7.8; glucose 146 Notable signs/symptoms: none noted Any previous deficiencies? No Bowel Habits: Every day to every other day no complaints   Body Composition Scale 07/12/2023 08/27/2023  Current Body Weight 261.2 246.1  Total Body Fat % 33.8 30.2  Visceral Fat 25 21  Fat-Free Mass % 66.1 69.7   Total Body Water  % 47.1 50.7  Muscle-Mass lbs 49.2 46.4  BMI 36.2 33.8  Body Fat Displacement           Torso  lbs 54.8 46.2         Left Leg  lbs 10.9 9.2         Right Leg  lbs 10.9 9.2         Left Arm  lbs 5.4 4.6         Right Arm  lbs 5.4 4.6    Lifestyle & Dietary Hx  Pt states he is tired of protein shakes. Pt is doing well. Asked a lot of questions.  Pt agreeable to increase physical activity.  Estimated daily fluid intake: 48-64 oz Estimated daily protein intake: 80 g Supplements: multivitamin and calcium  Current average weekly physical activity: ADLs, walking, cycling some.   Post-Op Goals/ Signs/ Symptoms Using straws: no Drinking while eating: no Chewing/swallowing difficulties: no Changes in vision: no Changes to mood/headaches: no Hair loss/changes to skin/nails: no Difficulty focusing/concentrating: no Sweating: no Limb weakness: no Dizziness/lightheadedness: no Palpitations: no  Carbonated/caffeinated beverages: no N/V/D/C/Gas: no Abdominal pain: no Dumping syndrome: no   NUTRITION DIAGNOSIS  Overweight/obesity ([place]-3.3) related to past poor dietary habits and  physical inactivity as evidenced by completed bariatric surgery and following dietary guidelines for continued weight loss and healthy nutrition status.   NUTRITION INTERVENTION Nutrition counseling (C-1) and education (E-2) to facilitate bariatric surgery goals, including: Diet advancement to the standard prep plan The importance of consuming adequate calories as well as certain nutrients daily due to the body's need for essential vitamins, minerals, and fats The importance of daily physical activity and to reach a goal of at least 150 minutes of moderate to vigorous physical activity weekly (or as directed by their physician) due to benefits such as increased musculature and improved lab values The importance of intuitive eating specifically learning hunger-satiety cues and understanding the importance of learning a new body: The importance of mindful eating to avoid grazing behaviors   Goals New: increase physical activity; use light weights or resistance bands to maintain muscle mass. New: track protein and fluid  Handouts Provided Include  Standard Prep Plan Advancement Guide  Learning Style & Readiness for Change Teaching method utilized: Visual & Auditory  Demonstrated degree of understanding via: Teach Back  Readiness Level: Ready Barriers to learning/adherence to lifestyle change: mobility  RD's Notes for Next Visit Assess adherence to pt chosen goals  MONITORING & EVALUATION Dietary intake, weekly physical activity, body weight.  Next Steps Patient is to follow-up in 4 months for 6 month post-op class/follow-up.

## 2023-12-21 ENCOUNTER — Ambulatory Visit: Payer: Medicare (Managed Care) | Admitting: Skilled Nursing Facility1

## 2023-12-28 ENCOUNTER — Ambulatory Visit: Payer: Medicare (Managed Care)

## 2024-01-25 ENCOUNTER — Encounter: Payer: Medicare (Managed Care) | Attending: General Surgery | Admitting: Skilled Nursing Facility1

## 2024-01-25 ENCOUNTER — Encounter: Payer: Self-pay | Admitting: Skilled Nursing Facility1

## 2024-01-25 DIAGNOSIS — Z713 Dietary counseling and surveillance: Secondary | ICD-10-CM | POA: Diagnosis not present

## 2024-01-25 DIAGNOSIS — E669 Obesity, unspecified: Secondary | ICD-10-CM | POA: Diagnosis present

## 2024-01-25 NOTE — Progress Notes (Unsigned)
 Bariatric Nutrition Follow-Up Visit Medical Nutrition Therapy  Appt Start Time: 5:15-5:45  Surgery date: 06/28/2023 Surgery type: RYGB  NUTRITION ASSESSMENT  Anthropometrics  Start weight at [facility]: 299.0 lbs (date: 11/16/2022)  Height: 71 in Weight today: pt declined    Clinical   Pt states he takes his mom to dialysis.  Pt states he tries not to eat past 7-8pm.  Pt states he is getting a knee replacement next week.   Pharmacotherapy: History of weight loss medication used: none  Medical hx: hyperlipidemia, sleep apnea, HTN Medications: see list Labs: A1c 6.0 Notable signs/symptoms: none noted Any previous deficiencies? No Bowel Habits: Every day to every other day no complaints   Body Composition Scale 07/12/2023 08/27/2023  Current Body Weight 261.2 246.1  Total Body Fat % 33.8 30.2  Visceral Fat 25 21  Fat-Free Mass % 66.1 69.7   Total Body Water  % 47.1 50.7  Muscle-Mass lbs 49.2 46.4  BMI 36.2 33.8  Body Fat Displacement           Torso  lbs 54.8 46.2         Left Leg  lbs 10.9 9.2         Right Leg  lbs 10.9 9.2         Left Arm  lbs 5.4 4.6         Right Arm  lbs 5.4 4.6    Lifestyle & Dietary Hx  24 hr recall: First meal 8:30-9am: breakfast bar or egg + sausage + 1/2 one slice whole wheat toast Snack: grapes Second meal 1-2: Snack: small bag of chip or fruit  Third meal:  Estimated daily fluid intake: 48-64 oz Estimated daily protein intake: 80 g Supplements: multivitamin and calcium  Current average weekly physical activity: ADLs, walking, cycling some.   Post-Op Goals/ Signs/ Symptoms Using straws: no Drinking while eating: no Chewing/swallowing difficulties: no Changes in vision: no Changes to mood/headaches: no Hair loss/changes to skin/nails: no Difficulty focusing/concentrating: no Sweating: no Limb weakness: no Dizziness/lightheadedness: no Palpitations: no  Carbonated/caffeinated beverages: no N/V/D/C/Gas: no Abdominal pain:  no Dumping syndrome: no   NUTRITION DIAGNOSIS  Overweight/obesity ([place]-3.3) related to past poor dietary habits and physical inactivity as evidenced by completed bariatric surgery and following dietary guidelines for continued weight loss and healthy nutrition status.   NUTRITION INTERVENTION Nutrition counseling (C-1) and education (E-2) to facilitate bariatric surgery goals, including: Creation of balanced and diverse meals to increase the intake of nutrient-rich foods that provide essential vitamins, minerals, fiber, and phytonutrients Variety of Fruits and Vegetables: Aim for a colorful array of fruits and vegetables to ensure a wide range of nutrients. Include a mix of leafy greens, berries, citrus fruits, cruciferous vegetables, and more. Whole Grains: Choose whole grains over refined grains. Examples include brown rice, quinoa, oats, whole wheat, and barley. Lean Proteins: Include lean sources of protein, such as poultry, fish, tofu, legumes, beans, lentils, and low-fat dairy products. Limit red and processed meats. Healthy Fats: Incorporate sources of healthy fats, including avocados, nuts, seeds, and olive oil. Limit saturated and trans fats found in fried and processed foods. Dairy or Dairy Alternatives: Choose low-fat or fat-free dairy products, or plant-based alternatives like almond or soy milk. Portion Control: Be mindful of portion sizes to avoid overeating. Pay attention to hunger and satisfaction cues. Limit Added Sugars: Minimize the consumption of sugary beverages, snacks, and desserts. Check food labels for added sugars and opt for natural sources of sweetness such as whole fruits. Hydration: Drink  plenty of water  throughout the day. Limit sugary drinks and excessive caffeine intake. Moderate Sodium Intake: Reduce the consumption of high-sodium foods. Use herbs and spices for flavor instead of excessive salt. Meal Planning and Preparation: Plan and prepare meals  ahead of time to make healthier choices more convenient. Include a mix of food groups in each meal. Limit Processed Foods: Minimize the intake of highly processed and packaged foods that are often high in added sugars, salt, and unhealthy fats. Regular Physical Activity: Combine a healthy diet with regular physical activity for overall well-being. Aim for at least 150 minutes of moderate-intensity aerobic exercise per week, along with strength training. Moderation and Balance: Enjoy treats and indulgent foods in moderation, emphasizing balance rather than strict restriction.  Handouts Previously Provided Include  Standard Prep Plan Advancement Guide  Learning Style & Readiness for Change Teaching method utilized: Visual & Auditory  Demonstrated degree of understanding via: Teach Back  Readiness Level: Ready Barriers to learning/adherence to lifestyle change: mobility  RD's Notes for Next Visit Assess adherence to pt chosen goals  MONITORING & EVALUATION Dietary intake, weekly physical activity, body weight.  Next Steps Patient is to follow-up in 4 months

## 2024-05-01 ENCOUNTER — Encounter: Payer: Medicare (Managed Care) | Attending: General Surgery | Admitting: Skilled Nursing Facility1

## 2024-05-01 ENCOUNTER — Encounter: Payer: Self-pay | Admitting: Skilled Nursing Facility1

## 2024-05-01 DIAGNOSIS — Z713 Dietary counseling and surveillance: Secondary | ICD-10-CM | POA: Insufficient documentation

## 2024-05-01 DIAGNOSIS — E669 Obesity, unspecified: Secondary | ICD-10-CM | POA: Insufficient documentation

## 2024-05-01 NOTE — Progress Notes (Unsigned)
 Bariatric Nutrition Follow-Up Visit Medical Nutrition Therapy  Appt Start Time: 5:15-5:45  Surgery date: 06/28/2023 Surgery type: RYGB  NUTRITION ASSESSMENT  Anthropometrics  Start weight at [facility]: 299.0 lbs (date: 11/16/2022)  Height: 71 in Weight today: pt declined    Clinical   Pt states he tries not to eat past 7-8pm.  Pt states he did gt his knee replacement and is in therapy stating things are going well.  Pt states he is a night owl which is troublesome considering he has to wake early to help his mom.   Pharmacotherapy: History of weight loss medication used: none  Medical hx: hyperlipidemia, sleep apnea, HTN Medications: see list Labs: A1c 6.0 Notable signs/symptoms: none noted Any previous deficiencies? No Bowel Habits: Every day to every other day no complaints   Body Composition Scale 07/12/2023 08/27/2023  Current Body Weight 261.2 246.1  Total Body Fat % 33.8 30.2  Visceral Fat 25 21  Fat-Free Mass % 66.1 69.7   Total Body Water  % 47.1 50.7  Muscle-Mass lbs 49.2 46.4  BMI 36.2 33.8  Body Fat Displacement           Torso  lbs 54.8 46.2         Left Leg  lbs 10.9 9.2         Right Leg  lbs 10.9 9.2         Left Arm  lbs 5.4 4.6         Right Arm  lbs 5.4 4.6    Lifestyle & Dietary Hx  24 hr recall: Nuts throughout the day; 2-3 granola bars  First meal 8:30-9am: cereal  Snack 11: grapes + orange  Second meal 1-2: cereal Snack: Third meal: chicken pot pie Snack: apples and grapes  Beverages: water  with flavoring, half and half tea  Estimated daily fluid intake: 48-64 oz Estimated daily protein intake: 60 g Supplements: multivitamin and calcium  Current average weekly physical activity: [employer] 3-4 times a week cardio  Post-Op Goals/ Signs/ Symptoms Using straws: no Drinking while eating: no Chewing/swallowing difficulties: no Changes in vision: no Changes to mood/headaches: no Hair loss/changes to skin/nails: no Difficulty  focusing/concentrating: no Sweating: no Limb weakness: no Dizziness/lightheadedness: no Palpitations: no  Carbonated/caffeinated beverages: no N/V/D/C/Gas: no Abdominal pain: no Dumping syndrome: no   NUTRITION DIAGNOSIS  Overweight/obesity ([place]-3.3) related to past poor dietary habits and physical inactivity as evidenced by completed bariatric surgery and following dietary guidelines for continued weight loss and healthy nutrition status.   NUTRITION INTERVENTION Nutrition counseling (C-1) and education (E-2) to facilitate bariatric surgery goals, including:   Handouts Previously Provided Include  Standard Prep Plan Advancement Guide  Learning Style & Readiness for Change Teaching method utilized: Visual & Auditory  Demonstrated degree of understanding via: Teach Back  Readiness Level: Ready Barriers to learning/adherence to lifestyle change: mobility  RD's Notes for Next Visit Assess adherence to pt chosen goals  MONITORING & EVALUATION Dietary intake, weekly physical activity, body weight.  Next Steps Patient is to follow-up

## 2024-09-04 ENCOUNTER — Encounter: Payer: Medicare (Managed Care) | Admitting: Skilled Nursing Facility1
